# Patient Record
Sex: Female | Born: 1975 | Race: White | Hispanic: No | Marital: Married | Smoking: Never smoker
Health system: Southern US, Community
[De-identification: ages and names within clinical notes are randomized; demographics above are authoritative.]

## PROBLEM LIST (undated history)

## (undated) DIAGNOSIS — F419 Anxiety disorder, unspecified: Secondary | ICD-10-CM

## (undated) HISTORY — PX: DILATION AND CURETTAGE OF UTERUS: SHX78

---

## 2008-10-28 ENCOUNTER — Ambulatory Visit: Payer: Self-pay

## 2009-05-25 ENCOUNTER — Observation Stay: Payer: Self-pay

## 2009-05-28 ENCOUNTER — Inpatient Hospital Stay: Payer: Self-pay | Admitting: Obstetrics and Gynecology

## 2011-09-19 ENCOUNTER — Inpatient Hospital Stay: Payer: Self-pay

## 2011-09-19 LAB — CBC WITH DIFFERENTIAL/PLATELET
Basophil #: 0 10*3/uL (ref 0.0–0.1)
Eosinophil #: 0 10*3/uL (ref 0.0–0.7)
Eosinophil %: 0.1 %
Lymphocyte #: 1.6 10*3/uL (ref 1.0–3.6)
MCH: 28.2 pg (ref 26.0–34.0)
Neutrophil #: 11.1 10*3/uL — ABNORMAL HIGH (ref 1.4–6.5)
Neutrophil %: 82.8 %
RDW: 13.9 % (ref 11.5–14.5)
WBC: 13.5 10*3/uL — ABNORMAL HIGH (ref 3.6–11.0)

## 2013-04-11 LAB — HM MAMMOGRAPHY: HM Mammogram: NORMAL

## 2014-01-06 ENCOUNTER — Ambulatory Visit: Payer: Self-pay | Admitting: Nurse Practitioner

## 2014-08-02 NOTE — Op Note (Signed)
PATIENT NAME:  Vanessa Mcneil, Vanessa Mcneil MR#:  [id number] DATE OF BIRTH:  May 15, 1975  DATE OF PROCEDURE:  09/19/2011  PREOPERATIVE DIAGNOSES:  1. Intrauterine pregnancy, term. 2. Previous Cesarean section. 3. Failed attempted VBAC. 4. Fetal intolerance to labor. 5. Failure to progress.   POSTOPERATIVE DIAGNOSES:  1. Intrauterine pregnancy, term. 2. Previous Cesarean section. 3. Failed attempted VBAC. 4. Fetal intolerance to labor. 5. Failure to progress.   OPERATION PERFORMED: Low transverse Cesarean section.   SURGEON: Deloris Pinghilip Mcneil. Rashunda Passon, MD   FIRST ASSIST: jamie    OPERATIVE FINDINGS: 8 pound, 2 ounce female infant delivered at 8:05 a.m., Apgars 9 and 9, Layla.   OPERATION: After adequate conduction anesthesia, the patient was prepped and draped in routine fashion. Skin incision in modified Pfannenstiel fashion was made through previous scar and carried down the various layers and the peritoneal cavity was entered. Bladder flap was created and the bladder was pushed down. A low transverse incision was made whereupon the infant's hand delivered. This was replaced and the vertex was delivered without difficulty along with the remaining part of the body. The placenta was removed manually. The uterus was then closed with continuous lock suture of chromic one. Several additional sutures were required for hemostasis. Ultimately hemostasis was obtained. The pelvis was lavaged with copious amounts of saline. Rectus muscles were reapproximated. On-Q pump was placed. Fascia was reapproximated with continuous sutures of Maxon. Skin was closed with skin staples. Estimated blood loss 500 mL. The patient tolerated the procedure well and left the operating room in good condition. Sponge and needle counts were said to be correct at the end of the procedure.   ____________________________ Deloris PingPhilip Mcneil. Luella Cookosenow, MD pjr:drc D: 09/19/2011 08:52:14 ET T: 09/19/2011 11:33:19 ET JOB#: [id number]  cc: Deloris PingPhilip Mcneil. Luella Cookosenow, MD,  <Dictator> Towana BadgerPHILIP Mcneil Qusay Villada MD ELECTRONICALLY SIGNED 09/27/2011 6:10
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
## 2015-02-07 ENCOUNTER — Encounter: Payer: Self-pay | Admitting: Internal Medicine

## 2015-02-07 ENCOUNTER — Other Ambulatory Visit: Payer: Self-pay | Admitting: Internal Medicine

## 2015-02-07 DIAGNOSIS — B009 Herpesviral infection, unspecified: Secondary | ICD-10-CM | POA: Insufficient documentation

## 2015-02-07 DIAGNOSIS — Z309 Encounter for contraceptive management, unspecified: Secondary | ICD-10-CM | POA: Insufficient documentation

## 2015-02-07 DIAGNOSIS — B002 Herpesviral gingivostomatitis and pharyngotonsillitis: Secondary | ICD-10-CM | POA: Insufficient documentation

## 2015-02-07 DIAGNOSIS — F419 Anxiety disorder, unspecified: Secondary | ICD-10-CM | POA: Insufficient documentation

## 2015-02-07 DIAGNOSIS — Z808 Family history of malignant neoplasm of other organs or systems: Secondary | ICD-10-CM | POA: Insufficient documentation

## 2015-05-04 ENCOUNTER — Encounter: Payer: Self-pay | Admitting: Internal Medicine

## 2015-05-05 ENCOUNTER — Ambulatory Visit (INDEPENDENT_AMBULATORY_CARE_PROVIDER_SITE_OTHER): Payer: BLUE CROSS/BLUE SHIELD | Admitting: Internal Medicine

## 2015-05-05 ENCOUNTER — Encounter: Payer: Self-pay | Admitting: Internal Medicine

## 2015-05-05 VITALS — BP 112/80 | HR 72 | Ht 66.5 in | Wt 173.4 lb

## 2015-05-05 DIAGNOSIS — F419 Anxiety disorder, unspecified: Secondary | ICD-10-CM | POA: Diagnosis not present

## 2015-05-05 DIAGNOSIS — S76011A Strain of muscle, fascia and tendon of right hip, initial encounter: Secondary | ICD-10-CM

## 2015-05-05 MED ORDER — BUPROPION HCL ER (XL) 300 MG PO TB24: 300.0000 mg | ORAL_TABLET | Freq: Every day | ORAL | Status: DC
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
## 2015-05-26 ENCOUNTER — Other Ambulatory Visit: Payer: Self-pay | Admitting: Internal Medicine

## 2015-07-14 ENCOUNTER — Encounter: Payer: Self-pay | Admitting: Internal Medicine

## 2015-08-16 ENCOUNTER — Ambulatory Visit (INDEPENDENT_AMBULATORY_CARE_PROVIDER_SITE_OTHER): Payer: BC Managed Care – PPO

## 2015-08-16 DIAGNOSIS — A184 Tuberculosis of skin and subcutaneous tissue: Secondary | ICD-10-CM

## 2015-08-16 MED ORDER — TUBERCULIN PPD 5 UNIT/0.1ML ID SOLN
5.0000 [IU] | Freq: Once | INTRADERMAL | Status: AC
  Administered 2015-08-16: 5 [IU] via INTRADERMAL

## 2015-11-24 ENCOUNTER — Other Ambulatory Visit: Payer: Self-pay | Admitting: Internal Medicine

## 2015-11-24 ENCOUNTER — Encounter: Payer: Self-pay | Admitting: Internal Medicine

## 2015-11-24 ENCOUNTER — Ambulatory Visit (INDEPENDENT_AMBULATORY_CARE_PROVIDER_SITE_OTHER): Payer: BC Managed Care – PPO | Admitting: Internal Medicine

## 2015-11-24 VITALS — BP 108/78 | HR 80 | Temp 98.1°F | Resp 16 | Ht 66.5 in | Wt 163.4 lb

## 2015-11-24 DIAGNOSIS — J40 Bronchitis, not specified as acute or chronic: Secondary | ICD-10-CM | POA: Diagnosis not present

## 2015-11-24 DIAGNOSIS — F419 Anxiety disorder, unspecified: Secondary | ICD-10-CM

## 2015-11-24 MED ORDER — LEVOFLOXACIN 500 MG PO TABS: 500.0000 mg | ORAL_TABLET | Freq: Every day | ORAL | 0 refills | Status: DC

## 2015-11-24 MED ORDER — ALBUTEROL SULFATE HFA 108 (90 BASE) MCG/ACT IN AERS: 2.0000 | INHALATION_SPRAY | Freq: Four times a day (QID) | RESPIRATORY_TRACT | 0 refills | Status: DC | PRN

## 2015-11-24 NOTE — Patient Instructions (Addendum)
Begin Mucinex-DM twice a day Acute Bronchitis Bronchitis is inflammation of the airways that extend from the windpipe into the lungs (bronchi). The inflammation often causes mucus to develop. This leads to a cough, which is the most common symptom of bronchitis.  In acute bronchitis, the condition usually develops suddenly and goes away over time, usually in a couple weeks. Smoking, allergies, and asthma can make bronchitis worse. Repeated episodes of bronchitis may cause further lung problems.  CAUSES Acute bronchitis is most often caused by the same virus that causes a cold. The virus can spread from person to person (contagious) through coughing, sneezing, and touching contaminated objects. SIGNS AND SYMPTOMS   Cough.   Fever.   Coughing up mucus.   Body aches.   Chest congestion.   Chills.   Shortness of breath.   Sore throat.  DIAGNOSIS  Acute bronchitis is usually diagnosed through a physical exam. Your health care provider will also ask you questions about your medical history. Tests, such as chest X-rays, are sometimes done to rule out other conditions.  TREATMENT  Acute bronchitis usually goes away in a couple weeks. Oftentimes, no medical treatment is necessary. Medicines are sometimes given for relief of fever or cough. Antibiotic medicines are usually not needed but may be prescribed in certain situations. In some cases, an inhaler may be recommended to help reduce shortness of breath and control the cough. A cool mist vaporizer may also be used to help thin bronchial secretions and make it easier to clear the chest.  HOME CARE INSTRUCTIONS  Get plenty of rest.   Drink enough fluids to keep your urine clear or pale yellow (unless you have a medical condition that requires fluid restriction). Increasing fluids may help thin your respiratory secretions (sputum) and reduce chest congestion, and it will prevent dehydration.   Take medicines only as directed by your  health care provider.  If you were prescribed an antibiotic medicine, finish it all even if you start to feel better.  Avoid smoking and secondhand smoke. Exposure to cigarette smoke or irritating chemicals will make bronchitis worse. If you are a smoker, consider using nicotine gum or skin patches to help control withdrawal symptoms. Quitting smoking will help your lungs heal faster.   Reduce the chances of another bout of acute bronchitis by washing your hands frequently, avoiding people with cold symptoms, and trying not to touch your hands to your mouth, nose, or eyes.   Keep all follow-up visits as directed by your health care provider.  SEEK MEDICAL CARE IF: Your symptoms do not improve after 1 week of treatment.  SEEK IMMEDIATE MEDICAL CARE IF:  You develop an increased fever or chills.   You have chest pain.   You have severe shortness of breath.  You have bloody sputum.   You develop dehydration.  You faint or repeatedly feel like you are going to pass out.  You develop repeated vomiting.  You develop a severe headache. MAKE SURE YOU:   Understand these instructions.  Will watch your condition.  Will get help right away if you are not doing well or get worse.   This information is not intended to replace advice given to you by your health care provider. Make sure you discuss any questions you have with your health care provider.   Document Released: 05/04/2004 Document Revised: 04/17/2014 Document Reviewed: 09/17/2012 Elsevier Interactive Patient Education [employer]2016 Elsevier Inc.

## 2015-11-24 NOTE — Progress Notes (Signed)
    Date:  11/24/2015   Name:  Vanessa Mcneil   DOB:  03/20/1976   MRN:  [id number]   Chief Complaint: Cough (chest congestion and cough after sinus stuff for 3 weeks no fever but fatigue and has had headaches. ) HPI    Review of Systems  Patient Active Problem List   Diagnosis Date Noted  . Anxiety 02/07/2015  . Family history of malignant melanoma 02/07/2015  . Herpes 02/07/2015  . Encounter for contraceptive management 02/07/2015    Prior to Admission medications   Medication Sig Start Date End Date Taking? Authorizing Provider  buPROPion (WELLBUTRIN XL) 300 MG 24 hr tablet Take 1 tablet (300 mg total) by mouth daily. 05/05/15   Reubin MilanLaura H Wileen Duncanson, MD    Allergies  Allergen Reactions  . Penicillins     Past Surgical History:  Procedure Laterality Date  . DILATION AND CURETTAGE OF UTERUS      Social History  Substance Use Topics  . Smoking status: Never Smoker  . Smokeless tobacco: Never Used  . Alcohol use 0.0 oz/week     Comment: rarely     Medication list has been reviewed and updated.   Physical Exam  Constitutional: She is oriented to person, place, and time. She appears well-developed. No distress.  HENT:  Head: Normocephalic and atraumatic.  Right Ear: Tympanic membrane and ear canal normal.  Left Ear: Tympanic membrane and ear canal normal.  Nose: Right sinus exhibits no maxillary sinus tenderness. Left sinus exhibits no maxillary sinus tenderness.  Mouth/Throat: No posterior oropharyngeal edema or posterior oropharyngeal erythema.  Cardiovascular: Normal rate, regular rhythm and normal heart sounds.   Pulmonary/Chest: Effort normal. No respiratory distress. She has wheezes. She has no rales. She exhibits no tenderness.  Musculoskeletal: Normal range of motion.  Neurological: She is alert and oriented to person, place, and time.  Skin: Skin is warm and dry. No rash noted.  Psychiatric: She has a normal mood and affect. Her behavior is normal. Thought  content normal.  Nursing note and vitals reviewed.   BP 108/78 (BP Location: Right Arm, Patient Position: Sitting, Cuff Size: Normal)   Pulse 80   Temp 98.1 F (36.7 C) (Oral)   Resp 16   Ht 5' 6.5" (1.689 m)   Wt 163 lb 6.4 oz (74.1 kg)   LMP 11/17/2015   SpO2 98%   BMI 25.98 kg/m   Assessment and Plan: 1. Bronchitis Mucinex-DM bid Call in a week if not improving - levofloxacin (LEVAQUIN) 500 MG tablet; Take 1 tablet (500 mg total) by mouth daily.  Dispense: 10 tablet; Refill: 0 - albuterol (PROVENTIL HFA;VENTOLIN HFA) 108 (90 Base) MCG/ACT inhaler; Inhale 2 puffs into the lungs every 6 (six) hours as needed for wheezing or shortness of breath.  Dispense: 1 Inhaler; Refill: 0   Bari EdwardLaura Creek Gan, MD [facility]  11/24/2015

## 2016-04-24 ENCOUNTER — Other Ambulatory Visit: Payer: Self-pay | Admitting: Internal Medicine

## 2016-04-24 DIAGNOSIS — F419 Anxiety disorder, unspecified: Secondary | ICD-10-CM

## 2016-04-28 NOTE — Telephone Encounter (Signed)
pts coming in on 05/10/16 for her cpe

## 2016-05-08 ENCOUNTER — Other Ambulatory Visit: Payer: Self-pay | Admitting: Internal Medicine

## 2016-05-08 ENCOUNTER — Telehealth: Payer: Self-pay | Admitting: *Deleted

## 2016-05-08 NOTE — Telephone Encounter (Signed)
I would be willing to refill bupropion as long as she can be seen within the next 6 months.

## 2016-05-08 NOTE — Telephone Encounter (Signed)
Patient called and states she has a upcoming CPE with Dr Judithann GravesBerglund on 05/10/16. Patient states she is having some financial  issues and she probably wont be able to come to that appt. She is wondering if Dr Judithann GravesBerglund can refill her meds without being see on that day so she can r/s her appt for a different day. Patient is requesting a call back. Her number is [phone]. Please advise.

## 2016-05-09 ENCOUNTER — Other Ambulatory Visit: Payer: Self-pay | Admitting: Internal Medicine

## 2016-05-09 DIAGNOSIS — F419 Anxiety disorder, unspecified: Secondary | ICD-10-CM

## 2016-05-09 MED ORDER — BUPROPION HCL ER (XL) 300 MG PO TB24: 300.0000 mg | ORAL_TABLET | Freq: Every day | ORAL | 5 refills | Status: DC

## 2016-05-09 NOTE — Telephone Encounter (Signed)
Pt transfer up front to make an appointment.

## 2016-05-10 ENCOUNTER — Ambulatory Visit (INDEPENDENT_AMBULATORY_CARE_PROVIDER_SITE_OTHER): Payer: BC Managed Care – PPO | Admitting: Internal Medicine

## 2016-05-10 ENCOUNTER — Other Ambulatory Visit: Payer: Self-pay | Admitting: Internal Medicine

## 2016-05-10 ENCOUNTER — Encounter: Payer: Self-pay | Admitting: Internal Medicine

## 2016-05-10 VITALS — BP 108/78 | HR 86 | Temp 98.2°F | Ht 66.5 in | Wt 177.0 lb

## 2016-05-10 DIAGNOSIS — Z0000 Encounter for general adult medical examination without abnormal findings: Secondary | ICD-10-CM | POA: Diagnosis not present

## 2016-05-10 DIAGNOSIS — Z86018 Personal history of other benign neoplasm: Secondary | ICD-10-CM | POA: Diagnosis not present

## 2016-05-10 DIAGNOSIS — F419 Anxiety disorder, unspecified: Secondary | ICD-10-CM | POA: Diagnosis not present

## 2016-05-10 DIAGNOSIS — Z1231 Encounter for screening mammogram for malignant neoplasm of breast: Secondary | ICD-10-CM

## 2016-05-10 LAB — POCT URINALYSIS DIPSTICK
Bilirubin, UA: NEGATIVE
Blood, UA: NEGATIVE
Glucose, UA: NEGATIVE
KETONES UA: NEGATIVE
LEUKOCYTES UA: NEGATIVE
Nitrite, UA: NEGATIVE
PH UA: 6.5
PROTEIN UA: NEGATIVE
SPEC GRAV UA: 1.015
UROBILINOGEN UA: 0.2

## 2016-05-10 NOTE — Patient Instructions (Signed)
Breast Self-Awareness Introduction Breast self-awareness means being familiar with how your breasts look and feel. It involves checking your breasts regularly and reporting any changes to your health care provider. Practicing breast self-awareness is important. A change in your breasts can be a sign of a serious medical problem. Being familiar with how your breasts look and feel allows you to find any problems early, when treatment is more likely to be successful. All women should practice breast self-awareness, including women who have had breast implants. How to do a breast self-exam One way to learn what is normal for your breasts and whether your breasts are changing is to do a breast self-exam. To do a breast self-exam: Look for Changes  1. Remove all the clothing above your waist. 2. Stand in front of a mirror in a room with good lighting. 3. Put your hands on your hips. 4. Push your hands firmly downward. 5. Compare your breasts in the mirror. Look for differences between them (asymmetry), such as:  Differences in shape.  Differences in size.  Puckers, dips, and bumps in one breast and not the other. 6. Look at each breast for changes in your skin, such as:  Redness.  Scaly areas. 7. Look for changes in your nipples, such as:  Discharge.  Bleeding.  Dimpling.  Redness.  A change in position. Feel for Changes  Carefully feel your breasts for lumps and changes. It is best to do this while lying on your back on the floor and again while sitting or standing in the shower or tub with soapy water on your skin. Feel each breast in the following way:  Place the arm on the side of the breast you are examining above your head.  Feel your breast with the other hand.  Start in the nipple area and make  inch (2 cm) overlapping circles to feel your breast. Use the pads of your three middle fingers to do this. Apply light pressure, then medium pressure, then firm pressure. The light  pressure will allow you to feel the tissue closest to the skin. The medium pressure will allow you to feel the tissue that is a little deeper. The firm pressure will allow you to feel the tissue close to the ribs.  Continue the overlapping circles, moving downward over the breast until you feel your ribs below your breast.  Move one finger-width toward the center of the body. Continue to use the  inch (2 cm) overlapping circles to feel your breast as you move slowly up toward your collarbone.  Continue the up and down exam using all three pressures until you reach your armpit. Write Down What You Find  Write down what is normal for each breast and any changes that you find. Keep a written record with breast changes or normal findings for each breast. By writing this information down, you do not need to depend only on memory for size, tenderness, or location. Write down where you are in your menstrual cycle, if you are still menstruating. If you are having trouble noticing differences in your breasts, do not get discouraged. With time you will become more familiar with the variations in your breasts and more comfortable with the exam. How often should I examine my breasts? Examine your breasts every month. If you are breastfeeding, the best time to examine your breasts is after a feeding or after using a breast pump. If you menstruate, the best time to examine your breasts is 5-7 days after your   period is over. During your period, your breasts are lumpier, and it may be more difficult to notice changes. When should I see my health care provider? See your health care provider if you notice:  A change in shape or size of your breasts or nipples.  A change in the skin of your breast or nipples, such as a reddened or scaly area.  Unusual discharge from your nipples.  A lump or thick area that was not there before.  Pain in your breasts.  Anything that concerns you. This information is not  intended to replace advice given to you by your health care provider. Make sure you discuss any questions you have with your health care provider. Document Released: 03/27/2005 Document Revised: 09/02/2015 Document Reviewed: 02/14/2015  2017 Elsevier  

## 2016-05-10 NOTE — Progress Notes (Signed)
Date:  05/10/2016   Name:  Vanessa Mcneil   DOB:  October 13, 1975   MRN:  [id number]   Chief Complaint: Annual Exam Vanessa Mcneil is a 41 y.o. female who presents today for her Complete Annual Exam. She feels well. She reports exercising very little. She reports she is sleeping well. She denies breast problems.  Menses are still regular but closer together and lighter. [facility] - last pap normal about a year ago.  She is due for Mammogram.    Review of Systems  Constitutional: Negative for chills, fatigue and fever.  HENT: Negative for congestion, hearing loss, tinnitus, trouble swallowing and voice change.   Eyes: Negative for visual disturbance.  Respiratory: Negative for cough, chest tightness, shortness of breath and wheezing.   Cardiovascular: Negative for chest pain, palpitations and leg swelling.  Gastrointestinal: Negative for abdominal pain, constipation, diarrhea and vomiting.  Endocrine: Negative for polydipsia and polyuria.  Genitourinary: Negative for dysuria, frequency, genital sores, vaginal bleeding and vaginal discharge.  Musculoskeletal: Negative for arthralgias, gait problem and joint swelling.  Skin: Negative for color change and rash.  Neurological: Negative for dizziness, tremors, light-headedness and headaches.  Hematological: Negative for adenopathy. Does not bruise/bleed easily.  Psychiatric/Behavioral: Negative for behavioral problems, decreased concentration, dysphoric mood and sleep disturbance. The patient is nervous/anxious.     Patient Active Problem List   Diagnosis Date Noted  . Anxiety 02/07/2015  . Family history of malignant melanoma 02/07/2015  . Herpes 02/07/2015  . Encounter for contraceptive management 02/07/2015    Prior to Admission medications   Medication Sig Start Date End Date Taking? Authorizing Provider  albuterol (PROVENTIL HFA;VENTOLIN HFA) 108 (90 Base) MCG/ACT inhaler Inhale 2 puffs into the lungs every 6 (six)  hours as needed for wheezing or shortness of breath. 11/24/15  Yes Reubin MilanLaura H Brie Eppard, MD  buPROPion (WELLBUTRIN XL) 300 MG 24 hr tablet Take 1 tablet (300 mg total) by mouth daily. 05/09/16  Yes Reubin MilanLaura H Raygan Skarda, MD    Allergies  Allergen Reactions  . Penicillins     Past Surgical History:  Procedure Laterality Date  . DILATION AND CURETTAGE OF UTERUS      Social History  Substance Use Topics  . Smoking status: Never Smoker  . Smokeless tobacco: Never Used  . Alcohol use 0.0 oz/week     Comment: rarely     Medication list has been reviewed and updated.   Physical Exam  Constitutional: She is oriented to person, place, and time. She appears well-developed and well-nourished. No distress.  HENT:  Head: Normocephalic and atraumatic.  Right Ear: Tympanic membrane and ear canal normal.  Left Ear: Tympanic membrane and ear canal normal.  Nose: Right sinus exhibits no maxillary sinus tenderness. Left sinus exhibits no maxillary sinus tenderness.  Mouth/Throat: Uvula is midline and oropharynx is clear and moist.  Eyes: Conjunctivae and EOM are normal. Right eye exhibits no discharge. Left eye exhibits no discharge. No scleral icterus.  Neck: Normal range of motion. Carotid bruit is not present. No erythema present. No thyromegaly present.  Cardiovascular: Normal rate, regular rhythm, normal heart sounds and normal pulses.   Pulmonary/Chest: Effort normal. No respiratory distress. She has no wheezes. Right breast exhibits no mass, no nipple discharge, no skin change and no tenderness. Left breast exhibits no mass, no nipple discharge, no skin change and no tenderness.  Abdominal: Soft. Bowel sounds are normal. There is no hepatosplenomegaly. There is no tenderness. There is no  CVA tenderness.  Musculoskeletal: Normal range of motion.  Lymphadenopathy:    She has no cervical adenopathy.    She has no axillary adenopathy.  Neurological: She is alert and oriented to person, place, and  time. She has normal reflexes. No cranial nerve deficit or sensory deficit.  Skin: Skin is warm, dry and intact. No rash noted.  Psychiatric: She has a normal mood and affect. Her speech is normal and behavior is normal. Thought content normal.  Nursing note and vitals reviewed.   BP 108/78   Pulse 86   Temp 98.2 F (36.8 C)   Ht 5' 6.5" (1.689 m)   Wt 177 lb (80.3 kg)   SpO2 98%   BMI 28.14 kg/m   Assessment and Plan: 1. Annual physical exam Resume regular exercise Continue healthy diet F/u with GYN for Pap and Mammogram - CBC with Differential/Platelet - Comprehensive metabolic panel - Lipid panel - POCT urinalysis dipstick  2. Anxiety Doing well on Bupropion - TSH  3. H/O dysplastic nevus Recommend annual skin survey   Vanessa Edward, MD [facility]  05/10/2016

## 2016-05-11 LAB — CBC WITH DIFFERENTIAL/PLATELET
BASOS ABS: 0 10*3/uL (ref 0.0–0.2)
Basos: 1 %
EOS (ABSOLUTE): 0.1 10*3/uL (ref 0.0–0.4)
EOS: 1 %
HEMATOCRIT: 45.9 % (ref 34.0–46.6)
HEMOGLOBIN: 14.4 g/dL (ref 11.1–15.9)
Immature Grans (Abs): 0 10*3/uL (ref 0.0–0.1)
Immature Granulocytes: 0 %
LYMPHS ABS: 2.4 10*3/uL (ref 0.7–3.1)
LYMPHS: 39 %
MCH: 28.5 pg (ref 26.6–33.0)
MCHC: 31.4 g/dL — AB (ref 31.5–35.7)
MCV: 91 fL (ref 79–97)
Monocytes Absolute: 0.6 10*3/uL (ref 0.1–0.9)
Monocytes: 9 %
NEUTROS ABS: 3.1 10*3/uL (ref 1.4–7.0)
Neutrophils: 50 %
Platelets: 258 10*3/uL (ref 150–379)
RBC: 5.05 x10E6/uL (ref 3.77–5.28)
RDW: 14.2 % (ref 12.3–15.4)
WBC: 6.2 10*3/uL (ref 3.4–10.8)

## 2016-05-11 LAB — COMPREHENSIVE METABOLIC PANEL
ALBUMIN: 4.6 g/dL (ref 3.5–5.5)
ALT: 16 IU/L (ref 0–32)
AST: 15 IU/L (ref 0–40)
Albumin/Globulin Ratio: 2.1 (ref 1.2–2.2)
Alkaline Phosphatase: 56 IU/L (ref 39–117)
BUN / CREAT RATIO: 17 (ref 9–23)
BUN: 13 mg/dL (ref 6–24)
Bilirubin Total: 0.8 mg/dL (ref 0.0–1.2)
CALCIUM: 9.5 mg/dL (ref 8.7–10.2)
CO2: 19 mmol/L (ref 18–29)
CREATININE: 0.78 mg/dL (ref 0.57–1.00)
Chloride: 102 mmol/L (ref 96–106)
GFR, EST AFRICAN AMERICAN: 108 mL/min/{1.73_m2} (ref >59)
GFR, EST NON AFRICAN AMERICAN: 94 mL/min/{1.73_m2} (ref >59)
GLOBULIN, TOTAL: 2.2 g/dL (ref 1.5–4.5)
GLUCOSE: 90 mg/dL (ref 65–99)
Potassium: 4.4 mmol/L (ref 3.5–5.2)
SODIUM: 142 mmol/L (ref 134–144)
TOTAL PROTEIN: 6.8 g/dL (ref 6.0–8.5)

## 2016-05-11 LAB — TSH: TSH: 2.04 u[IU]/mL (ref 0.450–4.500)

## 2016-05-11 LAB — LIPID PANEL
CHOL/HDL RATIO: 2.3 ratio (ref 0.0–4.4)
CHOLESTEROL TOTAL: 152 mg/dL (ref 100–199)
HDL: 67 mg/dL (ref >39)
LDL CALC: 72 mg/dL (ref 0–99)
Triglycerides: 65 mg/dL (ref 0–149)
VLDL CHOLESTEROL CAL: 13 mg/dL (ref 5–40)

## 2016-05-16 ENCOUNTER — Encounter: Payer: Self-pay | Admitting: Radiology

## 2016-05-16 ENCOUNTER — Ambulatory Visit
Admission: RE | Admit: 2016-05-16 | Discharge: 2016-05-16 | Disposition: A | Discharge status: Home / Self Care | Payer: BC Managed Care – PPO | Source: Ambulatory Visit | Attending: Internal Medicine | Admitting: Internal Medicine

## 2016-05-16 DIAGNOSIS — Z1231 Encounter for screening mammogram for malignant neoplasm of breast: Secondary | ICD-10-CM | POA: Insufficient documentation

## 2016-05-26 ENCOUNTER — Other Ambulatory Visit: Payer: Self-pay | Admitting: Internal Medicine

## 2016-05-26 DIAGNOSIS — F419 Anxiety disorder, unspecified: Secondary | ICD-10-CM

## 2016-10-30 ENCOUNTER — Ambulatory Visit: Payer: Self-pay | Admitting: Obstetrics and Gynecology

## 2016-11-15 ENCOUNTER — Ambulatory Visit: Payer: Self-pay | Admitting: Obstetrics and Gynecology

## 2017-01-01 ENCOUNTER — Ambulatory Visit (INDEPENDENT_AMBULATORY_CARE_PROVIDER_SITE_OTHER): Payer: BC Managed Care – PPO | Admitting: Obstetrics and Gynecology

## 2017-01-01 ENCOUNTER — Encounter: Payer: Self-pay | Admitting: Obstetrics and Gynecology

## 2017-01-01 VITALS — BP 100/70 | HR 100 | Ht 66.0 in | Wt 178.0 lb

## 2017-01-01 DIAGNOSIS — Z124 Encounter for screening for malignant neoplasm of cervix: Secondary | ICD-10-CM | POA: Diagnosis not present

## 2017-01-01 DIAGNOSIS — Z01419 Encounter for gynecological examination (general) (routine) without abnormal findings: Secondary | ICD-10-CM | POA: Diagnosis not present

## 2017-01-01 DIAGNOSIS — Z1231 Encounter for screening mammogram for malignant neoplasm of breast: Secondary | ICD-10-CM

## 2017-01-01 DIAGNOSIS — F411 Generalized anxiety disorder: Secondary | ICD-10-CM | POA: Diagnosis not present

## 2017-01-01 DIAGNOSIS — Z1239 Encounter for other screening for malignant neoplasm of breast: Secondary | ICD-10-CM

## 2017-01-01 MED ORDER — HYDROXYZINE HCL 25 MG PO TABS: 25.0000 mg | ORAL_TABLET | Freq: Four times a day (QID) | ORAL | 2 refills | Status: DC | PRN

## 2017-01-01 NOTE — Patient Instructions (Signed)
Preventive Care 40-64 Years, Female Preventive care refers to lifestyle choices and visits with your health care provider that can promote health and wellness. What does preventive care include?  A yearly physical exam. This is also called an annual well check.  Dental exams once or twice a year.  Routine eye exams. Ask your health care provider how often you should have your eyes checked.  Personal lifestyle choices, including: ? Daily care of your teeth and gums. ? Regular physical activity. ? Eating a healthy diet. ? Avoiding tobacco and drug use. ? Limiting alcohol use. ? Practicing safe sex. ? Taking low-dose aspirin daily starting at age 58. ? Taking vitamin and mineral supplements as recommended by your health care provider. What happens during an annual well check? The services and screenings done by your health care provider during your annual well check will depend on your age, overall health, lifestyle risk factors, and family history of disease. Counseling Your health care provider may ask you questions about your:  Alcohol use.  Tobacco use.  Drug use.  Emotional well-being.  Home and relationship well-being.  Sexual activity.  Eating habits.  Work and work Statistician.  Method of birth control.  Menstrual cycle.  Pregnancy history.  Screening You may have the following tests or measurements:  Height, weight, and BMI.  Blood pressure.  Lipid and cholesterol levels. These may be checked every 5 years, or more frequently if you are over 81 years old.  Skin check.  Lung cancer screening. You may have this screening every year starting at age 78 if you have a 30-pack-year history of smoking and currently smoke or have quit within the past 15 years.  Fecal occult blood test (FOBT) of the stool. You may have this test every year starting at age 65.  Flexible sigmoidoscopy or colonoscopy. You may have a sigmoidoscopy every 5 years or a colonoscopy  every 10 years starting at age 30.  Hepatitis C blood test.  Hepatitis B blood test.  Sexually transmitted disease (STD) testing.  Diabetes screening. This is done by checking your blood sugar (glucose) after you have not eaten for a while (fasting). You may have this done every 1-3 years.  Mammogram. This may be done every 1-2 years. Talk to your health care provider about when you should start having regular mammograms. This may depend on whether you have a family history of breast cancer.  BRCA-related cancer screening. This may be done if you have a family history of breast, ovarian, tubal, or peritoneal cancers.  Pelvic exam and Pap test. This may be done every 3 years starting at age 80. Starting at age 36, this may be done every 5 years if you have a Pap test in combination with an HPV test.  Bone density scan. This is done to screen for osteoporosis. You may have this scan if you are at high risk for osteoporosis.  Discuss your test results, treatment options, and if necessary, the need for more tests with your health care provider. Vaccines Your health care provider may recommend certain vaccines, such as:  Influenza vaccine. This is recommended every year.  Tetanus, diphtheria, and acellular pertussis (Tdap, Td) vaccine. You may need a Td booster every 10 years.  Varicella vaccine. You may need this if you have not been vaccinated.  Zoster vaccine. You may need this after age 5.  Measles, mumps, and rubella (MMR) vaccine. You may need at least one dose of MMR if you were born in  1957 or later. You may also need a second dose.  Pneumococcal 13-valent conjugate (PCV13) vaccine. You may need this if you have certain conditions and were not previously vaccinated.  Pneumococcal polysaccharide (PPSV23) vaccine. You may need one or two doses if you smoke cigarettes or if you have certain conditions.  Meningococcal vaccine. You may need this if you have certain  conditions.  Hepatitis A vaccine. You may need this if you have certain conditions or if you travel or work in places where you may be exposed to hepatitis A.  Hepatitis B vaccine. You may need this if you have certain conditions or if you travel or work in places where you may be exposed to hepatitis B.  Haemophilus influenzae type b (Hib) vaccine. You may need this if you have certain conditions.  Talk to your health care provider about which screenings and vaccines you need and how often you need them. This information is not intended to replace advice given to you by your health care provider. Make sure you discuss any questions you have with your health care provider. Document Released: 04/23/2015 Document Revised: 12/15/2015 Document Reviewed: 01/26/2015 Elsevier Interactive Patient Education  2017 [employer].

## 2017-01-01 NOTE — Progress Notes (Signed)
Patient ID: Vanessa Mcneil, female   DOB: 09/23/1975, 41 y.o.   MRN: [id number]    Gynecology Annual Exam  PCP: Reubin Milan, MD  Chief Complaint:  Chief Complaint  Patient presents with  . Gynecologic Exam    History of Present Illness: Patient is a 41 y.o. [id number] presents for annual exam. The patient has no complaints today.   LMP: Patient's last menstrual period was 12/20/2016. Regular monthly bleeding, lasting 4-5 days, no intermenstrual spotting, no postcoital spotting   The patient is sexually active. She currently uses vasectomy for contraception. She denies dyspareunia.  The patient does perform self breast exams.  There is no notable family history of breast or ovarian cancer in her family.  The patient wears seatbelts: yes.   The patient has regular exercise: not asked.    The patient reports current symptoms of depression.  She has a history of generalized anxiety, recently had Welbutrin dose increased.  She inquired about additional options  Review of Systems: Review of Systems  Constitutional: Negative for chills and fever.  HENT: Negative for congestion.   Respiratory: Negative for cough and shortness of breath.   Cardiovascular: Negative for chest pain and palpitations.  Gastrointestinal: Negative for abdominal pain, constipation, diarrhea, heartburn, nausea and vomiting.  Genitourinary: Negative for dysuria, frequency and urgency.  Skin: Negative for itching and rash.  Neurological: Negative for dizziness and headaches.  Endo/Heme/Allergies: Negative for polydipsia.  Psychiatric/Behavioral: Negative for depression.    Past Medical History:  History reviewed. No pertinent past medical history.  Past Surgical History:  Past Surgical History:  Procedure Laterality Date  . DILATION AND CURETTAGE OF UTERUS      Gynecologic History:  Patient's last menstrual period was 12/20/2016. Contraception: vasectomy Last Pap: Results were: 01/20/15 NIL and HR HPV  negative  Last mammogram: 05/16/16 Results were: Elby Showers I   Obstetric History: [id number]  Family History:  Family History  Problem Relation Age of Onset  . Diabetes Father   . Breast cancer Neg Hx     Social History:  Social History   Social History  . Marital status: Married    Spouse name: N/A  . Number of children: N/A  . Years of education: N/A   Occupational History  . Not on file.   Social History Main Topics  . Smoking status: Never Smoker  . Smokeless tobacco: Never Used  . Alcohol use 0.0 oz/week     Comment: rarely  . Drug use: No  . Sexual activity: Yes     Comment: Vasectomy   Other Topics Concern  . Not on file   Social History Narrative  . No narrative on file    Allergies:  Allergies  Allergen Reactions  . Penicillins     Medications: Prior to Admission medications   Medication Sig Start Date End Date Taking? Authorizing Provider  buPROPion (WELLBUTRIN XL) 300 MG 24 hr tablet Take 1 tablet (300 mg total) by mouth daily. 05/09/16  Yes Reubin Milan, MD    Physical Exam Vitals: Blood pressure 100/70, pulse 100, height  (1.676 m), weight 178 lb (80.7 kg), last menstrual period 12/20/2016.  General: NAD HEENT: normocephalic, anicteric Thyroid: no enlargement, no palpable nodules Pulmonary: No increased work of breathing, CTAB Cardiovascular: RRR, distal pulses 2+ Breast: Breast symmetrical, no tenderness, no palpable nodules or masses, no skin or nipple retraction present, no nipple discharge.  No axillary or supraclavicular lymphadenopathy. Abdomen: NABS, soft, non-tender, non-distended.  Umbilicus without lesions.  No hepatomegaly, splenomegaly or masses palpable. No evidence of hernia  Genitourinary:  External: Normal external female genitalia.  Normal urethral meatus, normal  Bartholin's and Skene's glands.    Vagina: Normal vaginal mucosa, no evidence of prolapse.    Cervix: Grossly normal in appearance, no bleeding  Uterus:  Non-enlarged, mobile, normal contour.  No CMT  Adnexa: ovaries non-enlarged, no adnexal masses  Rectal: deferred  Lymphatic: no evidence of inguinal lymphadenopathy Extremities: no edema, erythema, or tenderness Neurologic: Grossly intact Psychiatric: mood appropriate, affect full  Female chaperone present for pelvic and breast  portions of the physical exam    Assessment: 41 y.o. [id number] routine annual exam  Plan: Problem List Items Addressed This Visit    None    Visit Diagnoses    Generalized anxiety disorder    -  Primary   Screening for malignant neoplasm of cervix       Relevant Orders   PapIG, HPV, rfx 16/18   Breast screening       Relevant Orders   MM DIGITAL SCREENING BILATERAL   Encounter for gynecological examination without abnormal finding       Relevant Orders   PapIG, HPV, rfx 16/18      1) Mammogram - recommend yearly screening mammogram.  Mammogram Is up to date   2) STI screening was not offered  3) ASCCP guidelines and rational discussed.  Patient opts for 2 year screening interval  4) Contraception - vasectomy  5) Colonoscopy -- Screening recommended starting at age 21 for average risk individuals, age 16 for individuals deemed at increased risk (including African Americans) and recommended to continue until age 53.  For patient age 43-43 individualized approach is recommended.  Gold standard screening is via colonoscopy, Cologuard screening is an acceptable alternative for patient unwilling or unable to undergo colonoscopy.  "Colorectal cancer screening for average?risk adults: 2018 guideline update from the American Cancer Society"CA: A Cancer Journal for Clinicians: Sep 06, 2016   6) Routine healthcare maintenance including cholesterol, diabetes screening discussed managed by PCP  7) Anxiety - add vistaril to current regimen prn we discussed if continued symptoms consider switching to lexapro from welbutrin

## 2017-01-03 ENCOUNTER — Encounter: Payer: Self-pay | Admitting: Obstetrics and Gynecology

## 2017-01-03 LAB — PAPIG, HPV, RFX 16/18
HPV, high-risk: NEGATIVE
PAP Smear Comment: 0

## 2017-01-15 ENCOUNTER — Encounter: Payer: Self-pay | Admitting: Obstetrics and Gynecology

## 2017-01-22 ENCOUNTER — Telehealth: Payer: Self-pay

## 2017-01-22 ENCOUNTER — Other Ambulatory Visit: Payer: Self-pay | Admitting: Obstetrics and Gynecology

## 2017-01-22 MED ORDER — ALPRAZOLAM 0.25 MG PO TABS: 0.2500 mg | ORAL_TABLET | Freq: Two times a day (BID) | ORAL | 0 refills | Status: DC | PRN

## 2017-01-22 NOTE — Telephone Encounter (Signed)
Left msg for pt rx ready for pick up.

## 2017-01-22 NOTE — Telephone Encounter (Signed)
Pt calling to see if Xanax rx is ready to be p/u.  [phone]

## 2017-01-30 ENCOUNTER — Ambulatory Visit: Payer: BC Managed Care – PPO

## 2017-02-05 ENCOUNTER — Ambulatory Visit (INDEPENDENT_AMBULATORY_CARE_PROVIDER_SITE_OTHER): Payer: BC Managed Care – PPO

## 2017-02-05 DIAGNOSIS — Z23 Encounter for immunization: Secondary | ICD-10-CM | POA: Diagnosis not present

## 2017-05-14 ENCOUNTER — Ambulatory Visit (INDEPENDENT_AMBULATORY_CARE_PROVIDER_SITE_OTHER): Payer: BC Managed Care – PPO | Admitting: Internal Medicine

## 2017-05-14 ENCOUNTER — Encounter: Payer: Self-pay | Admitting: Internal Medicine

## 2017-05-14 VITALS — BP 112/80 | HR 80 | Ht 66.0 in | Wt 180.0 lb

## 2017-05-14 DIAGNOSIS — F419 Anxiety disorder, unspecified: Secondary | ICD-10-CM | POA: Diagnosis not present

## 2017-05-14 DIAGNOSIS — F40298 Other specified phobia: Secondary | ICD-10-CM | POA: Diagnosis not present

## 2017-05-14 DIAGNOSIS — Z111 Encounter for screening for respiratory tuberculosis: Secondary | ICD-10-CM

## 2017-05-14 DIAGNOSIS — Z0000 Encounter for general adult medical examination without abnormal findings: Secondary | ICD-10-CM

## 2017-05-14 DIAGNOSIS — Z1239 Encounter for other screening for malignant neoplasm of breast: Secondary | ICD-10-CM

## 2017-05-14 LAB — POCT URINALYSIS DIPSTICK
BILIRUBIN UA: NEGATIVE
Blood, UA: NEGATIVE
GLUCOSE UA: NEGATIVE
KETONES UA: NEGATIVE
Leukocytes, UA: NEGATIVE
NITRITE UA: NEGATIVE
PROTEIN UA: NEGATIVE
Spec Grav, UA: 1.01 (ref 1.010–1.025)
Urobilinogen, UA: 0.2 E.U./dL
pH, UA: 6 (ref 5.0–8.0)

## 2017-05-14 NOTE — Patient Instructions (Signed)

## 2017-05-14 NOTE — Progress Notes (Signed)
Date:  05/14/2017   Name:  Vanessa Mcneil   DOB:  [birth year]   MRN:  [id number]   Chief Complaint: Annual Exam Vanessa Mcneil is a 42 y.o. female who presents today for her Complete Annual Exam. She feels fairly well. She reports exercising regularly. She reports she is sleeping well.  Pt had GYN exam and Pap in September 2018 - normal.  Mammogram ordered but not yet scheduled. 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  She recently had more anxiety and took additional xanax in December after her grandfather passed away.  She is doing better now and has not had to take any xanax in over a month.  She continues on Wellbutrin daily.   Anxiety  Presents for follow-up visit. Symptoms include nervous/anxious behavior. Patient reports no chest pain, dizziness, palpitations or shortness of breath. Symptoms occur rarely (taking Wellbutrin and Xanax prn). The severity of symptoms is mild. The quality of sleep is good.       Review of Systems  Constitutional: Negative for chills, fatigue and fever.  HENT: Negative for congestion, hearing loss, tinnitus, trouble swallowing and voice change.   Eyes: Negative for visual disturbance.  Respiratory: Positive for cough. Negative for chest tightness, shortness of breath and wheezing.   Cardiovascular: Negative for chest pain, palpitations and leg swelling.  Gastrointestinal: Negative for abdominal pain, constipation, diarrhea and vomiting.  Endocrine: Negative for polydipsia and polyuria.  Genitourinary: Negative for dysuria, frequency, genital sores, vaginal bleeding and vaginal discharge.  Musculoskeletal: Negative for arthralgias, gait problem and joint swelling.  Skin: Negative for color change and rash.  Neurological: Negative for dizziness, tremors, light-headedness and headaches.  Hematological: Negative for adenopathy. Does not bruise/bleed easily.  Psychiatric/Behavioral: Negative for dysphoric  mood and sleep disturbance. The patient is nervous/anxious.     Patient Active Problem List   Diagnosis Date Noted  . Anxiety 02/07/2015  . Family history of malignant melanoma 02/07/2015  . Herpes 02/07/2015  . Encounter for contraceptive management 02/07/2015    Prior to Admission medications   Medication Sig Start Date End Date Taking? Authorizing Provider  ALPRAZolam (XANAX) 0.25 MG tablet Take 1 tablet (0.25 mg total) by mouth 2 (two) times daily as needed for anxiety. 01/22/17  Yes Vena AustriaStaebler, Andreas, MD  buPROPion (WELLBUTRIN XL) 300 MG 24 hr tablet Take 1 tablet (300 mg total) by mouth daily. 05/09/16  Yes Reubin MilanBerglund, Tarnisha Kachmar H, MD    01/01/17  Yes Vena AustriaStaebler, Andreas, MD    Allergies  Allergen Reactions  . Penicillins     Past Surgical History:  Procedure Laterality Date  . DILATION AND CURETTAGE OF UTERUS      Social History   Tobacco Use  . Smoking status: Never Smoker  . Smokeless tobacco: Never Used  Substance Use Topics  . Alcohol use: Yes    Alcohol/week: 0.0 oz    Comment: rarely  . Drug use: No     Medication list has been reviewed and updated.  PHQ 2/9 Scores 05/14/2017 05/10/2016  PHQ - 2 Score 0 0    Physical Exam  Constitutional: She is oriented to person, place, and time. She appears well-developed. No distress.  HENT:  Head: Normocephalic and atraumatic.  Neck: Normal range of motion. Neck supple. Carotid bruit is not present. No thyromegaly present.  Cardiovascular: Normal rate and normal heart sounds.  Pulmonary/Chest: Effort normal and breath sounds normal. No respiratory distress. She has  no wheezes. She has no rales.  Abdominal: Soft. Normal appearance and bowel sounds are normal. There is no tenderness.  Musculoskeletal: Normal range of motion. She exhibits no edema.  Neurological: She is alert and oriented to person, place, and time. She has normal strength and normal reflexes. No sensory deficit.  Skin: Skin is warm, dry and intact. No rash  noted.  Psychiatric: She has a normal mood and affect. Her behavior is normal. Thought content normal.  Nursing note and vitals reviewed.   BP 112/80   Pulse 80   Ht 5\' 6"  (1.676 m)   Wt 180 lb (81.6 kg)   SpO2 98%   BMI 29.05 kg/m   Assessment and Plan: 1. Annual physical exam Normal exam Continue diet and exercise - CBC with Differential/Platelet - Comprehensive metabolic panel - POCT urinalysis dipstick  2. Breast cancer screening Annual screening recommended  3. Anxiety Doing well on medications - TSH  4. Screening for tuberculosis Lab done rather than additional stick for PPD due to syncope with needle sticks - QuantiFERON-TB Gold Plus   No orders of the defined types were placed in this encounter.   Partially dictated using Animal nutritionist. Any errors are unintentional.  Bari Edward, MD [facility]  05/14/2017

## 2017-05-17 LAB — QUANTIFERON-TB GOLD PLUS
QuantiFERON Nil Value: 0.09 IU/mL
QuantiFERON TB1 Ag Value: 0.07 IU/mL
QuantiFERON TB2 Ag Value: 0.04 IU/mL
QuantiFERON-TB Gold Plus: NEGATIVE

## 2017-05-17 LAB — COMPREHENSIVE METABOLIC PANEL
A/G RATIO: 2 (ref 1.2–2.2)
ALBUMIN: 4.3 g/dL (ref 3.5–5.5)
ALT: 19 IU/L (ref 0–32)
AST: 19 IU/L (ref 0–40)
Alkaline Phosphatase: 68 IU/L (ref 39–117)
BUN / CREAT RATIO: 16 (ref 9–23)
BUN: 13 mg/dL (ref 6–24)
Bilirubin Total: 0.3 mg/dL (ref 0.0–1.2)
CALCIUM: 9.1 mg/dL (ref 8.7–10.2)
CO2: 23 mmol/L (ref 20–29)
Chloride: 106 mmol/L (ref 96–106)
Creatinine, Ser: 0.79 mg/dL (ref 0.57–1.00)
GFR calc non Af Amer: 92 mL/min/{1.73_m2} (ref >59)
GFR, EST AFRICAN AMERICAN: 106 mL/min/{1.73_m2} (ref >59)
GLOBULIN, TOTAL: 2.1 g/dL (ref 1.5–4.5)
Glucose: 86 mg/dL (ref 65–99)
POTASSIUM: 4.6 mmol/L (ref 3.5–5.2)
SODIUM: 144 mmol/L (ref 134–144)
TOTAL PROTEIN: 6.4 g/dL (ref 6.0–8.5)

## 2017-05-17 LAB — CBC WITH DIFFERENTIAL/PLATELET
BASOS: 1 %
Basophils Absolute: 0 10*3/uL (ref 0.0–0.2)
EOS (ABSOLUTE): 0.1 10*3/uL (ref 0.0–0.4)
EOS: 1 %
HEMATOCRIT: 41.7 % (ref 34.0–46.6)
Hemoglobin: 13.3 g/dL (ref 11.1–15.9)
IMMATURE GRANULOCYTES: 0 %
Immature Grans (Abs): 0 10*3/uL (ref 0.0–0.1)
Lymphocytes Absolute: 2.1 10*3/uL (ref 0.7–3.1)
Lymphs: 39 %
MCH: 28.1 pg (ref 26.6–33.0)
MCHC: 31.9 g/dL (ref 31.5–35.7)
MCV: 88 fL (ref 79–97)
Monocytes Absolute: 0.4 10*3/uL (ref 0.1–0.9)
Monocytes: 8 %
NEUTROS PCT: 51 %
Neutrophils Absolute: 2.8 10*3/uL (ref 1.4–7.0)
Platelets: 272 10*3/uL (ref 150–379)
RBC: 4.73 x10E6/uL (ref 3.77–5.28)
RDW: 14 % (ref 12.3–15.4)
WBC: 5.4 10*3/uL (ref 3.4–10.8)

## 2017-05-17 LAB — TSH: TSH: 3.06 u[IU]/mL (ref 0.450–4.500)

## 2017-06-07 ENCOUNTER — Other Ambulatory Visit: Payer: Self-pay | Admitting: Internal Medicine

## 2017-06-07 DIAGNOSIS — F419 Anxiety disorder, unspecified: Secondary | ICD-10-CM

## 2017-06-19 ENCOUNTER — Ambulatory Visit
Admission: RE | Admit: 2017-06-19 | Discharge: 2017-06-19 | Disposition: A | Discharge status: Home / Self Care | Payer: BC Managed Care – PPO | Source: Ambulatory Visit | Attending: Obstetrics and Gynecology | Admitting: Obstetrics and Gynecology

## 2017-06-19 ENCOUNTER — Other Ambulatory Visit: Payer: Self-pay | Admitting: Obstetrics and Gynecology

## 2017-06-19 DIAGNOSIS — Z1231 Encounter for screening mammogram for malignant neoplasm of breast: Secondary | ICD-10-CM | POA: Diagnosis present

## 2017-06-19 DIAGNOSIS — Z1239 Encounter for other screening for malignant neoplasm of breast: Secondary | ICD-10-CM

## 2017-06-21 ENCOUNTER — Encounter: Payer: Self-pay | Admitting: Obstetrics and Gynecology

## 2017-09-06 ENCOUNTER — Ambulatory Visit: Payer: BC Managed Care – PPO | Admitting: Internal Medicine

## 2017-09-06 ENCOUNTER — Encounter: Payer: Self-pay | Admitting: Internal Medicine

## 2017-09-06 VITALS — BP 116/84 | HR 107 | Temp 98.4°F | Resp 16 | Ht 66.0 in | Wt 182.0 lb

## 2017-09-06 DIAGNOSIS — J029 Acute pharyngitis, unspecified: Secondary | ICD-10-CM

## 2017-09-06 LAB — POCT RAPID STREP A (OFFICE): Rapid Strep A Screen: NEGATIVE

## 2017-09-06 MED ORDER — AZITHROMYCIN 250 MG PO TABS: ORAL_TABLET | ORAL | 0 refills | Status: AC

## 2017-09-06 NOTE — Progress Notes (Signed)
Date:  09/06/2017   Name:  Vanessa Mcneil   DOB:  02/01/1976   MRN:  [id number]   Chief Complaint: Sore Throat Sore Throat   This is a new problem. The current episode started in the past 7 days. The problem has been gradually worsening. There has been no fever. Associated symptoms include trouble swallowing. Pertinent negatives include no ear pain, headaches or shortness of breath. She has had exposure to strep.    Review of Systems  Constitutional: Negative for chills, fatigue and fever.  HENT: Positive for sore throat and trouble swallowing. Negative for ear pain, postnasal drip and sinus pain.   Respiratory: Negative for chest tightness, shortness of breath and wheezing.   Cardiovascular: Negative for chest pain and palpitations.  Neurological: Negative for dizziness and headaches.    Patient Active Problem List   Diagnosis Date Noted  . 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 Anxiety 02/07/2015  . Family history of malignant melanoma 02/07/2015  . Herpes 02/07/2015  . Encounter for contraceptive management 02/07/2015    Prior to Admission medications   Medication Sig Start Date End Date Taking? Authorizing Provider  ALPRAZolam (XANAX) 0.25 MG tablet Take 1 tablet (0.25 mg total) by mouth 2 (two) times daily as needed for anxiety. 01/22/17  Yes Vena Austria, MD  buPROPion (WELLBUTRIN XL) 300 MG 24 hr tablet TAKE 1 TABLET (300 MG TOTAL) BY MOUTH DAILY. 06/07/17  Yes Reubin Milan, MD    Allergies  Allergen Reactions  . Penicillins     Past Surgical History:  Procedure Laterality Date  . DILATION AND CURETTAGE OF UTERUS      Social History   Tobacco Use  . Smoking status: Never Smoker  . Smokeless tobacco: Never Used  Substance Use Topics  . Alcohol use: Yes    Alcohol/week: 0.0 oz    Comment: rarely  . Drug use: No     Medication list has been reviewed and updated.  Current Meds  Medication Sig  . ALPRAZolam (XANAX) 0.25 MG tablet Take 1 tablet (0.25 mg  total) by mouth 2 (two) times daily as needed for anxiety.  Marland Kitchen buPROPion (WELLBUTRIN XL) 300 MG 24 hr tablet TAKE 1 TABLET (300 MG TOTAL) BY MOUTH DAILY.  . [DISCONTINUED] buPROPion (WELLBUTRIN XL) 300 MG 24 hr tablet Take 1 tablet (300 mg total) by mouth daily.    PHQ 2/9 Scores 05/14/2017 05/10/2016  PHQ - 2 Score 0 0    Physical Exam  Constitutional: She is oriented to person, place, and time. She appears well-developed. No distress.  HENT:  Head: Normocephalic and atraumatic.  Mouth/Throat: Uvula is midline and mucous membranes are normal. Posterior oropharyngeal erythema present. No oropharyngeal exudate.  Eyes: Pupils are equal, round, and reactive to light.  Neck: Normal range of motion. Neck supple.  Cardiovascular: Normal rate and regular rhythm.  Pulmonary/Chest: Effort normal. No respiratory distress. She has no wheezes.  Musculoskeletal: Normal range of motion.  Lymphadenopathy:    She has no cervical adenopathy.  Neurological: She is alert and oriented to person, place, and time.  Skin: Skin is warm and dry. No rash noted.  Psychiatric: She has a normal mood and affect. Her behavior is normal. Thought content normal.  Nursing note and vitals reviewed.   BP 116/84   Pulse (!) 107   Temp 98.4 F (36.9 C) (Oral)   Resp 16   Ht  (1.676 m)   Wt 182 lb (82.6 kg)   LMP  08/25/2017   SpO2 99%   BMI 29.38 kg/m   Assessment and Plan: 1. Pharyngitis, unspecified etiology Strep negative Suspect viral - will give zpak to take if s/s bacterial infection occur - azithromycin (ZITHROMAX Z-PAK) 250 MG tablet; UAD  Dispense: 6 each; Refill: 0 - POCT rapid strep A   Meds ordered this encounter  Medications  . azithromycin (ZITHROMAX Z-PAK) 250 MG tablet    Sig: UAD    Dispense:  6 each    Refill:  0    Partially dictated using Animal nutritionist. Any errors are unintentional.  Bari Edward, MD [facility]  09/06/2017   There  are no diagnoses linked to this encounter.

## 2017-09-20 ENCOUNTER — Other Ambulatory Visit: Payer: Self-pay | Admitting: Obstetrics and Gynecology

## 2017-09-20 MED ORDER — ALPRAZOLAM 0.25 MG PO TABS: 0.2500 mg | ORAL_TABLET | Freq: Two times a day (BID) | ORAL | 0 refills | Status: DC | PRN

## 2017-09-21 NOTE — Telephone Encounter (Signed)
Pt calling about xanax refill. Pt unsure if it has been filled. She had a message that it was, but pharmacy has not received it. [phone]#[phone]

## 2017-11-14 ENCOUNTER — Ambulatory Visit: Payer: BC Managed Care – PPO | Admitting: Family Medicine

## 2017-11-14 ENCOUNTER — Encounter: Payer: Self-pay | Admitting: Family Medicine

## 2017-11-14 VITALS — BP 122/78 | HR 97 | Resp 16 | Ht 66.0 in | Wt 180.0 lb

## 2017-11-14 DIAGNOSIS — B001 Herpesviral vesicular dermatitis: Secondary | ICD-10-CM | POA: Diagnosis not present

## 2017-11-14 MED ORDER — VALACYCLOVIR HCL 1 G PO TABS: 1000.0000 mg | ORAL_TABLET | Freq: Two times a day (BID) | ORAL | 0 refills | Status: DC

## 2017-11-14 NOTE — Progress Notes (Signed)
Name: Vanessa Mcneil   MRN: [id number]    DOB: May 05, 1975   Date:11/14/2017       Progress Note  Subjective  Chief Complaint  Chief Complaint  Patient presents with  . Mouth Lesions    wants valacyclovir had this 3 years ago.    Rash  This is a new (used for cold sore) problem. The current episode started today. The problem has been gradually worsening since onset. The affected locations include the lips. The rash is characterized by blistering. Associated with: sun. Pertinent negatives include no anorexia, congestion, cough, diarrhea, eye pain, facial edema, fatigue, fever, joint pain, nail changes, rhinorrhea, shortness of breath, sore throat or vomiting. Past treatments include nothing.    No problem-specific Assessment & Plan notes found for this encounter.   History reviewed. No pertinent past medical history.  Past Surgical History:  Procedure Laterality Date  . DILATION AND CURETTAGE OF UTERUS      Family History  Problem Relation Age of Onset  . Breast cancer Mother 44       ? if cancer or not  . Diabetes Father     Social History   Socioeconomic History  . Marital status: Married    Spouse name: Not on file  . Number of children: Not on file  . Years of education: Not on file  . Highest education level: Not on file  Occupational History  . Not on file  Social Needs  . Financial resource strain: Not on file  . Food insecurity:    Worry: Not on file    Inability: Not on file  . Transportation needs:    Medical: Not on file    Non-medical: Not on file  Tobacco Use  . Smoking status: Never Smoker  . Smokeless tobacco: Never Used  Substance and Sexual Activity  . Alcohol use: Yes    Alcohol/week: 0.0 oz    Comment: rarely  . Drug use: No  . Sexual activity: Yes    Comment: Vasectomy  Lifestyle  . Physical activity:    Days per week: Not on file    Minutes per session: Not on file  . Stress: Not on file  Relationships  . Social connections:     Talks on phone: Not on file    Gets together: Not on file    Attends religious service: Not on file    Active member of club or organization: Not on file    Attends meetings of clubs or organizations: Not on file    Relationship status: Not on file  . Intimate partner violence:    Fear of current or ex partner: Not on file    Emotionally abused: Not on file    Physically abused: Not on file    Forced sexual activity: Not on file  Other Topics Concern  . Not on file  Social History Narrative  . Not on file    Allergies  Allergen Reactions  . Penicillins     Outpatient Medications Prior to Visit  Medication Sig Dispense Refill  . ALPRAZolam (XANAX) 0.25 MG tablet Take 1 tablet (0.25 mg total) by mouth 2 (two) times daily as needed for anxiety. 40 tablet 0  . buPROPion (WELLBUTRIN XL) 300 MG 24 hr tablet TAKE 1 TABLET (300 MG TOTAL) BY MOUTH DAILY. 30 tablet 10   No facility-administered medications prior to visit.     Review of Systems  Constitutional: Negative for chills, fatigue, fever, malaise/fatigue and weight loss.  HENT: Negative for congestion, ear discharge, ear pain, rhinorrhea and sore throat.   Eyes: Negative for blurred vision and pain.  Respiratory: Negative for cough, sputum production, shortness of breath and wheezing.   Cardiovascular: Negative for chest pain, palpitations and leg swelling.  Gastrointestinal: Negative for abdominal pain, anorexia, blood in stool, constipation, diarrhea, heartburn, melena, nausea and vomiting.  Genitourinary: Negative for dysuria, frequency, hematuria and urgency.  Musculoskeletal: Negative for back pain, joint pain, myalgias and neck pain.  Skin: Positive for rash. Negative for nail changes.  Neurological: Negative for dizziness, tingling, sensory change, focal weakness and headaches.  Endo/Heme/Allergies: Negative for environmental allergies and polydipsia. Does not bruise/bleed easily.  Psychiatric/Behavioral: Negative for  depression and suicidal ideas. The patient is not nervous/anxious and does not have insomnia.      Objective  Vitals:   11/14/17 1333  BP: 122/78  Pulse: 97  Resp: 16  SpO2: 100%  Weight: 180 lb (81.6 kg)  Height: 5\' 6"  (1.676 m)    Physical Exam  Constitutional: No distress.  HENT:  Head: Normocephalic and atraumatic.  Right Ear: External ear normal.  Left Ear: External ear normal.  Nose: Nose normal.  Mouth/Throat: Oropharynx is clear and moist.  Eyes: Pupils are equal, round, and reactive to light. Conjunctivae and EOM are normal. Right eye exhibits no discharge. Left eye exhibits no discharge.  Neck: Normal range of motion. Neck supple. No JVD present. No thyromegaly present.  Cardiovascular: Normal rate, regular rhythm, normal heart sounds and intact distal pulses. Exam reveals no gallop and no friction rub.  No murmur heard. Pulmonary/Chest: Effort normal and breath sounds normal.  Abdominal: Soft. Bowel sounds are normal. She exhibits no mass. There is no tenderness. There is no guarding.  Musculoskeletal: Normal range of motion. She exhibits no edema.  Lymphadenopathy:    She has no cervical adenopathy.  Neurological: She is alert. She has normal reflexes.  Skin: Skin is warm and dry. Rash noted. Rash is vesicular. She is not diaphoretic.  Vesicle left upper lip  Nursing note and vitals reviewed.     Assessment & Plan  Problem List Items Addressed This Visit    None    Visit Diagnoses    Cold sore    -  Primary   Reactivated when exposed to sun damage/ refill valcyclovir 1 gm 2 bid   Relevant Medications   valACYclovir (VALTREX) 1000 MG tablet      Meds ordered this encounter  Medications  . valACYclovir (VALTREX) 1000 MG tablet    Sig: Take 1 tablet (1,000 mg total) by mouth 2 (two) times daily. 2 tablets bid for 1 day prn    Dispense:  20 tablet    Refill:  0      Dr. Elizabeth Sauereanna Eleftheria Taborn [facility]  11/14/17  Cold sore

## 2018-01-02 ENCOUNTER — Ambulatory Visit: Payer: BC Managed Care – PPO | Admitting: Internal Medicine

## 2018-01-03 ENCOUNTER — Ambulatory Visit: Payer: BC Managed Care – PPO | Admitting: Internal Medicine

## 2018-01-03 ENCOUNTER — Ambulatory Visit: Payer: BC Managed Care – PPO | Admitting: Obstetrics and Gynecology

## 2018-01-03 ENCOUNTER — Encounter: Payer: Self-pay | Admitting: Internal Medicine

## 2018-01-03 VITALS — BP 122/86 | HR 80 | Ht 66.0 in | Wt 180.0 lb

## 2018-01-03 DIAGNOSIS — Z021 Encounter for pre-employment examination: Secondary | ICD-10-CM | POA: Diagnosis not present

## 2018-01-03 DIAGNOSIS — Z23 Encounter for immunization: Secondary | ICD-10-CM | POA: Diagnosis not present

## 2018-01-03 NOTE — Patient Instructions (Signed)
Tdap Vaccine (Tetanus, Diphtheria and Pertussis): What You Need to Know 1. Why get vaccinated? Tetanus, diphtheria and pertussis are very serious diseases. Tdap vaccine can protect us from these diseases. And, Tdap vaccine given to pregnant women can protect newborn babies against pertussis. TETANUS (Lockjaw) is rare in the [place] today. It causes painful muscle tightening and stiffness, usually all over the body.  It can lead to tightening of muscles in the head and neck so you can't open your mouth, swallow, or sometimes even breathe. Tetanus kills about 1 out of 10 people who are infected even after receiving the best medical care.  DIPHTHERIA is also rare in the [place] today. It can cause a thick coating to form in the back of the throat.  It can lead to breathing problems, heart failure, paralysis, and death.  PERTUSSIS (Whooping Cough) causes severe coughing spells, which can cause difficulty breathing, vomiting and disturbed sleep.  It can also lead to weight loss, incontinence, and rib fractures. Up to 2 in 100 adolescents and 5 in 100 adults with pertussis are hospitalized or have complications, which could include pneumonia or death.  These diseases are caused by bacteria. Diphtheria and pertussis are spread from person to person through secretions from coughing or sneezing. Tetanus enters the body through cuts, scratches, or wounds. Before vaccines, as many as 200,000 cases of diphtheria, 200,000 cases of pertussis, and hundreds of cases of tetanus, were reported in the [place] each year. Since vaccination began, reports of cases for tetanus and diphtheria have dropped by about 99% and for pertussis by about 80%. 2. Tdap vaccine Tdap vaccine can protect adolescents and adults from tetanus, diphtheria, and pertussis. One dose of Tdap is routinely given at age 11 or 12. People who did not get Tdap at that age should get it as soon as possible. Tdap is especially  important for healthcare professionals and anyone having close contact with a baby younger than 12 months. Pregnant women should get a dose of Tdap during every pregnancy, to protect the newborn from pertussis. Infants are most at risk for severe, life-threatening complications from pertussis. Another vaccine, called Td, protects against tetanus and diphtheria, but not pertussis. A Td booster should be given every 10 years. Tdap may be given as one of these boosters if you have never gotten Tdap before. Tdap may also be given after a severe cut or burn to prevent tetanus infection. Your doctor or the person giving you the vaccine can give you more information. Tdap may safely be given at the same time as other vaccines. 3. Some people should not get this vaccine  A person who has ever had a life-threatening allergic reaction after a previous dose of any diphtheria, tetanus or pertussis containing vaccine, OR has a severe allergy to any part of this vaccine, should not get Tdap vaccine. Tell the person giving the vaccine about any severe allergies.  Anyone who had coma or long repeated seizures within 7 days after a childhood dose of DTP or DTaP, or a previous dose of Tdap, should not get Tdap, unless a cause other than the vaccine was found. They can still get Td.  Talk to your doctor if you: ? have seizures or another nervous system problem, ? had severe pain or swelling after any vaccine containing diphtheria, tetanus or pertussis, ? ever had a condition called Guillain-Barr Syndrome (GBS), ? aren't feeling well on the day the shot is scheduled. 4. Risks With any medicine, including   vaccines, there is a chance of side effects. These are usually mild and go away on their own. Serious reactions are also possible but are rare. Most people who get Tdap vaccine do not have any problems with it. Mild problems following Tdap: (Did not interfere with activities)  Pain where the shot was given (about  3 in 4 adolescents or 2 in 3 adults)  Redness or swelling where the shot was given (about 1 person in 5)  Mild fever of at least 100.4F (up to about 1 in 25 adolescents or 1 in 100 adults)  Headache (about 3 or 4 people in 10)  Tiredness (about 1 person in 3 or 4)  Nausea, vomiting, diarrhea, stomach ache (up to 1 in 4 adolescents or 1 in 10 adults)  Chills, sore joints (about 1 person in 10)  Body aches (about 1 person in 3 or 4)  Rash, swollen glands (uncommon)  Moderate problems following Tdap: (Interfered with activities, but did not require medical attention)  Pain where the shot was given (up to 1 in 5 or 6)  Redness or swelling where the shot was given (up to about 1 in 16 adolescents or 1 in 12 adults)  Fever over 102F (about 1 in 100 adolescents or 1 in 250 adults)  Headache (about 1 in 7 adolescents or 1 in 10 adults)  Nausea, vomiting, diarrhea, stomach ache (up to 1 or 3 people in 100)  Swelling of the entire arm where the shot was given (up to about 1 in 500).  Severe problems following Tdap: (Unable to perform usual activities; required medical attention)  Swelling, severe pain, bleeding and redness in the arm where the shot was given (rare).  Problems that could happen after any vaccine:  People sometimes faint after a medical procedure, including vaccination. Sitting or lying down for about 15 minutes can help prevent fainting, and injuries caused by a fall. Tell your doctor if you feel dizzy, or have vision changes or ringing in the ears.  Some people get severe pain in the shoulder and have difficulty moving the arm where a shot was given. This happens very rarely.  Any medication can cause a severe allergic reaction. Such reactions from a vaccine are very rare, estimated at fewer than 1 in a million doses, and would happen within a few minutes to a few hours after the vaccination. As with any medicine, there is a very remote chance of a vaccine  causing a serious injury or death. The safety of vaccines is always being monitored. For more information, visit: www.cdc.gov/vaccinesafety/ 5. What if there is a serious problem? What should I look for? Look for anything that concerns you, such as signs of a severe allergic reaction, very high fever, or unusual behavior. Signs of a severe allergic reaction can include hives, swelling of the face and throat, difficulty breathing, a fast heartbeat, dizziness, and weakness. These would usually start a few minutes to a few hours after the vaccination. What should I do?  If you think it is a severe allergic reaction or other emergency that can't wait, call 9-1-1 or get the person to the nearest hospital. Otherwise, call your doctor.  Afterward, the reaction should be reported to the Vaccine Adverse Event Reporting System (VAERS). Your doctor might file this report, or you can do it yourself through the VAERS web site at www.vaers.hhs.gov, or by calling 1-800-822-7967. ? VAERS does not give medical advice. 6. The National Vaccine Injury Compensation Program The National   Vaccine Injury Compensation Program (VICP) is a federal program that was created to compensate people who may have been injured by certain vaccines. Persons who believe they may have been injured by a vaccine can learn about the program and about filing a claim by calling 1-800-338-2382 or visiting the VICP website at www.hrsa.gov/vaccinecompensation. There is a time limit to file a claim for compensation. 7. How can I learn more?  Ask your doctor. He or she can give you the vaccine package insert or suggest other sources of information.  Call your local or state health department.  Contact the Centers for Disease Control and Prevention (CDC): ? Call 1-800-232-4636 (1-800-CDC-INFO) or ? Visit CDC's website at www.cdc.gov/vaccines CDC Tdap Vaccine VIS (06/03/13) This information is not intended to replace advice given to you by your  health care provider. Make sure you discuss any questions you have with your health care provider. Document Released: 09/26/2011 Document Revised: 12/16/2015 Document Reviewed: 12/16/2015 Elsevier Interactive Patient Education  2017 Elsevier Inc.  

## 2018-01-03 NOTE — Progress Notes (Signed)
    Date:  01/03/2018   Name:  Vanessa Mcneil   DOB:  [birth year]   MRN:  [id number]   Chief Complaint: No chief complaint on file.  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  She feels well, no complaints today. She will get influenza at work Needs Tdap Quantiferon gold done in February.    Review of Systems  Constitutional: Negative for fatigue and fever.  Respiratory: Negative for choking and shortness of breath.   Cardiovascular: Negative for chest pain, palpitations and leg swelling.  Gastrointestinal: Negative for constipation and diarrhea.  Musculoskeletal: Negative for arthralgias, back pain and joint swelling.  Skin: Negative for color change and pallor.  Allergic/Immunologic: Negative for environmental allergies.  Neurological: Negative for dizziness and headaches.  Psychiatric/Behavioral: Negative for sleep disturbance.    Patient Active Problem List   Diagnosis Date Noted  . 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 Anxiety 02/07/2015  . Family history of malignant melanoma 02/07/2015  . 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 Encounter for contraceptive management 02/07/2015    Allergies  Allergen Reactions  . Penicillins     Past Surgical History:  Procedure Laterality Date  . DILATION AND CURETTAGE OF UTERUS      Social History   Tobacco Use  . Smoking status: Never Smoker  . Smokeless tobacco: Never Used  Substance Use Topics  . Alcohol use: Yes    Alcohol/week: 0.0 standard drinks    Comment: rarely  . Drug use: No     Medication list has been reviewed and updated.  Current Meds  Medication Sig  . ALPRAZolam (XANAX) 0.25 MG tablet Take 1 tablet (0.25 mg total) by mouth 2 (two) times daily as needed for anxiety.  Marland Kitchen buPROPion (WELLBUTRIN XL) 300 MG 24 hr tablet TAKE 1 TABLET (300 MG TOTAL) BY MOUTH DAILY.  . valACYclovir (VALTREX) 1000 MG tablet Take 1 tablet (1,000 mg total) by mouth 2 (two) times daily. 2 tablets bid for 1 day  prn    PHQ 2/9 Scores 05/14/2017 05/10/2016  PHQ - 2 Score 0 0    Physical Exam  Constitutional: She is oriented to person, place, and time. She appears well-developed. No distress.  HENT:  Head: Normocephalic and atraumatic.  Neck: Normal range of motion. Neck supple. Carotid bruit is not present.  Cardiovascular: Normal rate, regular rhythm and normal heart sounds.  Pulmonary/Chest: Effort normal and breath sounds normal. No respiratory distress.  Musculoskeletal: Normal range of motion.  Lymphadenopathy:    She has no cervical adenopathy.  Neurological: She is alert and oriented to person, place, and time.  Skin: Skin is warm and dry. No rash noted.  Psychiatric: She has a normal mood and affect. Her behavior is normal. Thought content normal.  Nursing note and vitals reviewed.   BP 122/86 (BP Location: Right Arm, Patient Position: Sitting, Cuff Size: Normal)   Pulse 80   Ht 5\' 6"  (1.676 m)   Wt 180 lb (81.6 kg)   SpO2 100%   BMI 29.05 kg/m   Assessment and Plan: 1. Pre-employment examination Form completed  2. Need for diphtheria-tetanus-pertussis (Tdap) vaccine - Tdap vaccine greater than or equal to 7yo IM   Partially dictated using Animal nutritionist. Any errors are unintentional.  Bari Edward, MD [facility]  01/03/2018

## 2018-01-11 ENCOUNTER — Ambulatory Visit (INDEPENDENT_AMBULATORY_CARE_PROVIDER_SITE_OTHER): Payer: BC Managed Care – PPO | Admitting: Obstetrics and Gynecology

## 2018-01-11 ENCOUNTER — Encounter: Payer: Self-pay | Admitting: Obstetrics and Gynecology

## 2018-01-11 VITALS — BP 120/70 | HR 104 | Ht 67.0 in | Wt 179.0 lb

## 2018-01-11 DIAGNOSIS — F419 Anxiety disorder, unspecified: Secondary | ICD-10-CM

## 2018-01-11 DIAGNOSIS — Z1239 Encounter for other screening for malignant neoplasm of breast: Secondary | ICD-10-CM | POA: Diagnosis not present

## 2018-01-11 DIAGNOSIS — Z01411 Encounter for gynecological examination (general) (routine) with abnormal findings: Secondary | ICD-10-CM | POA: Diagnosis not present

## 2018-01-11 DIAGNOSIS — Z01419 Encounter for gynecological examination (general) (routine) without abnormal findings: Secondary | ICD-10-CM

## 2018-01-11 MED ORDER — ALPRAZOLAM 0.25 MG PO TABS: 0.2500 mg | ORAL_TABLET | Freq: Two times a day (BID) | ORAL | 0 refills | Status: DC | PRN

## 2018-01-11 MED ORDER — BUPROPION HCL ER (XL) 300 MG PO TB24: 300.0000 mg | ORAL_TABLET | Freq: Every day | ORAL | 10 refills | Status: DC

## 2018-01-11 NOTE — Progress Notes (Signed)
Gynecology Annual Exam  PCP: Vanessa Milan, MD  Chief Complaint:  Chief Complaint  Patient presents with  . Gynecologic Exam    History of Present Illness: Patient is a 42 y.o. [id number] presents for annual exam. The patient has no complaints today.   LMP: Patient's last menstrual period was 01/01/2018. Average Interval: regular, 28 days Duration of flow: 5 days Heavy Menses: no Clots: no Intermenstrual Bleeding: no Postcoital Bleeding: no Dysmenorrhea: no    The patient is sexually active. She currently uses vasectomy for contraception. She denies dyspareunia.  The patient does perform self breast exams.  There is no notable family history of breast or ovarian cancer in her family.  The patient wears seatbelts: yes.   The patient has regular exercise: no.    The patient reports current symptoms of depression.  Stable on current regimen  Review of Systems: Review of Systems  Constitutional: Negative for chills and fever.  HENT: Negative for congestion.   Respiratory: Negative for cough and shortness of breath.   Cardiovascular: Negative for chest pain and palpitations.  Gastrointestinal: Negative for abdominal pain, constipation, diarrhea, heartburn, nausea and vomiting.  Genitourinary: Negative for dysuria, frequency and urgency.  Skin: Negative for itching and rash.  Neurological: Negative for dizziness and headaches.  Endo/Heme/Allergies: Negative for polydipsia.  Psychiatric/Behavioral: Negative for depression.    Past Medical History:  History reviewed. No pertinent past medical history.  Past Surgical History:  Past Surgical History:  Procedure Laterality Date  . DILATION AND CURETTAGE OF UTERUS      Gynecologic History:  Patient's last menstrual period was 01/01/2018. Contraception: vasectomy Last Pap: Results were: 01/01/2017 NIL and HR HPV negative  Last mammogram: 06/19/2017 Results were: Elby Showers I  Obstetric History: [id number]  Family History:    Family History  Problem Relation Age of Onset  . Breast cancer Mother 49  . Skin cancer Mother   . Diabetes Father   . Throat cancer Father   . Lung cancer Paternal Grandfather 32    Social History:  Social History   Socioeconomic History  . Marital status: Married    Spouse name: Not on file  . Number of children: Not on file  . Years of education: Not on file  . Highest education level: Not on file  Occupational History  . Not on file  Social Needs  . Financial resource strain: Not on file  . Food insecurity:    Worry: Not on file    Inability: Not on file  . Transportation needs:    Medical: Not on file    Non-medical: Not on file  Tobacco Use  . Smoking status: Never Smoker  . Smokeless tobacco: Never Used  Substance and Sexual Activity  . Alcohol use: Yes    Alcohol/week: 0.0 standard drinks    Comment: rarely  . Drug use: No  . Sexual activity: Yes    Birth control/protection: Other-see comments    Comment: Vasectomy  Lifestyle  . Physical activity:    Days per week: Not on file    Minutes per session: Not on file  . Stress: Not on file  Relationships  . Social connections:    Talks on phone: Not on file    Gets together: Not on file    Attends religious service: Not on file    Active member of club or organization: Not on file    Attends meetings of clubs or organizations: Not on file    Relationship status:  Not on file  . Intimate partner violence:    Fear of current or ex partner: Not on file    Emotionally abused: Not on file    Physically abused: Not on file    Forced sexual activity: Not on file  Other Topics Concern  . Not on file  Social History Narrative  . Not on file    Allergies:  Allergies  Allergen Reactions  . Penicillins     Medications: Prior to Admission medications   Medication Sig Start Date End Date Taking? Authorizing Provider  ALPRAZolam (XANAX) 0.25 MG tablet Take 1 tablet (0.25 mg total) by mouth 2 (two) times  daily as needed for anxiety. 09/20/17  Yes Vena Austria, MD  buPROPion (WELLBUTRIN XL) 300 MG 24 hr tablet TAKE 1 TABLET (300 MG TOTAL) BY MOUTH DAILY. 06/07/17  Yes Vanessa Milan, MD  valACYclovir (VALTREX) 1000 MG tablet Take 1 tablet (1,000 mg total) by mouth 2 (two) times daily. 2 tablets bid for 1 day prn 11/14/17   Duanne Limerick, MD    Physical Exam Vitals: Blood pressure 120/70, pulse (!) 104, height 5\' 7"  (1.702 m), weight 179 lb (81.2 kg), last menstrual period 01/01/2018.  General: NAD HEENT: normocephalic, anicteric Thyroid: no enlargement, no palpable nodules Pulmonary: No increased work of breathing, CTAB Cardiovascular: RRR, distal pulses 2+ Breast: Breast symmetrical, no tenderness, no palpable nodules or masses, no skin or nipple retraction present, no nipple discharge.  No axillary or supraclavicular lymphadenopathy. Abdomen: NABS, soft, non-tender, non-distended.  Umbilicus without lesions.  No hepatomegaly, splenomegaly or masses palpable. No evidence of hernia  Genitourinary:  External: Normal external female genitalia.  Normal urethral meatus, normal Bartholin's and Skene's glands.    Vagina: Normal vaginal mucosa, no evidence of prolapse.    Cervix: Grossly normal in appearance, no bleeding  Uterus: Non-enlarged, mobile, normal contour.  No CMT  Adnexa: ovaries non-enlarged, no adnexal masses  Rectal: deferred  Lymphatic: no evidence of inguinal lymphadenopathy Extremities: no edema, erythema, or tenderness Neurologic: Grossly intact Psychiatric: mood appropriate, affect full  Female chaperone present for pelvic and breast  portions of the physical exam    Assessment: 42 y.o. [id number] routine annual exam  Plan: Problem List Items Addressed This Visit    None      1) Mammogram - recommend yearly screening mammogram.  Mammogram Is up to date   2) STI screening  was notoffered and therefore not obtained  3) ASCCP guidelines and rational discussed.   Patient opts for every 2 years screening interval  4) Contraception - the patient is currently using  vasectomy.  She is happy with her current form of contraception and plans to continue  5) Colonoscopy -- Screening recommended starting at age 78 for average risk individuals, age 31 for individuals deemed at increased risk (including African Americans) and recommended to continue until age 44.  For patient age 63-85 individualized approach is recommended.  Gold standard screening is via colonoscopy, Cologuard screening is an acceptable alternative for patient unwilling or unable to undergo colonoscopy.  "Colorectal cancer screening for average?risk adults: 2018 guideline update from the American Cancer Society"CA: A Cancer Journal for Clinicians: Sep 06, 2016   6) Routine healthcare maintenance including cholesterol, diabetes screening discussed managed by PCP  7) Return in about 1 year (around 01/12/2019) for annual.   Vena Austria, MD, [facility], [facility] 01/11/2018, 9:57 PM

## 2018-01-19 ENCOUNTER — Ambulatory Visit
Admission: EM | Admit: 2018-01-19 | Discharge: 2018-01-19 | Disposition: A | Discharge status: Home / Self Care | Payer: BC Managed Care – PPO | Attending: Family Medicine | Admitting: Family Medicine

## 2018-01-19 ENCOUNTER — Other Ambulatory Visit: Payer: Self-pay

## 2018-01-19 DIAGNOSIS — J020 Streptococcal pharyngitis: Secondary | ICD-10-CM

## 2018-01-19 HISTORY — DX: Anxiety disorder, unspecified: F41.9

## 2018-01-19 LAB — RAPID STREP SCREEN (MED CTR MEBANE ONLY): Streptococcus, Group A Screen (Direct): POSITIVE — AB

## 2018-01-19 MED ORDER — CEFDINIR 300 MG PO CAPS: 300.0000 mg | ORAL_CAPSULE | Freq: Two times a day (BID) | ORAL | 0 refills | Status: DC

## 2018-01-19 NOTE — ED Triage Notes (Addendum)
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 Yesterday had sore throat and fell cold/hot. Feels fatigued. Would like a strep test done

## 2018-01-19 NOTE — ED Provider Notes (Signed)
[facility]    CSN: [id number] Arrival date & time: 01/19/18  1105  History   Chief Complaint Chief Complaint  Patient presents with  . Sore Throat   HPI  42 year old female presents with concerns for strep throat.  Symptoms started yesterday.  She developed sore throat.  She now has fatigue and associated chills.  She has felt febrile but has not taken her temperature.  She has taken Tylenol with some improvement in her sore throat.  No known exacerbating factors.  She has had sick contacts with strep throat.  Symptoms are moderate in severity.  No other associated symptoms.  No other complaints.   PMH, Surgical Hx, Family Hx, Social History reviewed and updated as below.  Past Medical History:  Diagnosis Date  . Anxiety    Patient Active Problem List   Diagnosis Date Noted  . 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 Anxiety 02/07/2015  . Family history of malignant melanoma 02/07/2015  . 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 Encounter for contraceptive management 02/07/2015   Past Surgical History:  Procedure Laterality Date  . DILATION AND CURETTAGE OF UTERUS     OB History    Gravida  4   Para  2   Term      Preterm      AB  2   Living  2     SAB  2   TAB      Ectopic      Multiple      Live Births  2          Home Medications    Prior to Admission medications   Medication Sig Start Date End Date Taking? Authorizing Provider  ALPRAZolam (XANAX) 0.25 MG tablet Take 1 tablet (0.25 mg total) by mouth 2 (two) times daily as needed for anxiety. 01/11/18   Vena Austria, MD  buPROPion (WELLBUTRIN XL) 300 MG 24 hr tablet Take 1 tablet (300 mg total) by mouth daily. 01/11/18   Vena Austria, MD  cefdinir (OMNICEF) 300 MG capsule Take 1 capsule (300 mg total) by mouth 2 (two) times daily. 01/19/18   Tommie Sams, DO  valACYclovir (VALTREX) 1000 MG tablet Take 1 tablet (1,000 mg total) by mouth 2 (two) times daily. 2 tablets  bid for 1 day prn 11/14/17   Duanne Limerick, MD   Family History Family History  Problem Relation Age of Onset  . Breast cancer Mother 28  . Skin cancer Mother   . Diabetes Father   . Throat cancer Father   . Lung cancer Paternal Grandfather 52   Social History Social History   Tobacco Use  . Smoking status: Never Smoker  . Smokeless tobacco: Never Used  Substance Use Topics  . Alcohol use: Yes    Alcohol/week: 0.0 standard drinks    Comment: rarely  . Drug use: No   Allergies   Penicillins   Review of Systems Review of Systems  Constitutional: Positive for chills and fatigue. Negative for fever.  HENT: Positive for sore throat.    Physical Exam Triage Vital Signs ED Triage Vitals  Enc Vitals Group     BP 01/19/18 1115 113/73     Pulse Rate 01/19/18 1115 93     Resp 01/19/18 1115 16     Temp 01/19/18 1115 98 F (36.7 C)     Temp Source 01/19/18 1115 Oral     SpO2 01/19/18 1115 99 %  Weight 01/19/18 1116 179 lb 0.2 oz (81.2 kg)     Height 01/19/18 1116 5\' 7"  (1.702 m)     Head Circumference --      Peak Flow --      Pain Score 01/19/18 1116 0     Pain Loc --      Pain Edu? --      Excl. in GC? --    Updated Vital Signs BP 113/73 (BP Location: Left Arm)   Pulse 93   Temp 98 F (36.7 C) (Oral)   Resp 16   Ht 5\' 7"  (1.702 m)   Wt 81.2 kg   LMP 01/01/2018   SpO2 99%   BMI 28.04 kg/m   Visual Acuity Right Eye Distance:   Left Eye Distance:   Bilateral Distance:    Right Eye Near:   Left Eye Near:    Bilateral Near:     Physical Exam  Constitutional: She is oriented to person, place, and time. She appears well-developed. No distress.  HENT:  Head: Normocephalic and atraumatic.  Oropharynx with mild erythema  Cardiovascular: Normal rate and regular rhythm.  Pulmonary/Chest: Effort normal and breath sounds normal. She has no wheezes. She has no rales.  Neurological: She is alert and oriented to person, place, and time.  Psychiatric: She has a  normal mood and affect. Her behavior is normal.  Nursing note and vitals reviewed.  UC Treatments / Results  Labs (all labs ordered are listed, but only abnormal results are displayed) Labs Reviewed  RAPID STREP SCREEN ([facility] ONLY) - Abnormal; Notable for the following components:      Result Value   Streptococcus, Group A Screen (Direct) POSITIVE (*)    All other components within normal limits    EKG None  Radiology No results found.  Procedures Procedures (including critical care time)  Medications Ordered in UC Medications - No data to display  Initial Impression / Assessment and Plan / UC Course  I have reviewed the triage vital signs and the nursing notes.  Pertinent labs & imaging results that were available during my care of the patient were reviewed by me and considered in my medical decision making (see chart for details).    42 year old female presents with strep pharyngitis.  Treating with Omnicef given reports of allergy when she was a child.  Final Clinical Impressions(s) / UC Diagnoses   Final diagnoses:  Strep pharyngitis   Discharge Instructions   None    ED Prescriptions    Medication Sig Dispense Auth. Provider   cefdinir (OMNICEF) 300 MG capsule Take 1 capsule (300 mg total) by mouth 2 (two) times daily. 20 capsule Tommie Sams, DO     Controlled Substance Prescriptions [place] Controlled Substance Registry consulted? Not Applicable   Tommie Sams, DO 01/19/18 1204

## 2018-06-25 ENCOUNTER — Other Ambulatory Visit: Payer: Self-pay | Admitting: Obstetrics and Gynecology

## 2018-06-25 MED ORDER — ALPRAZOLAM 0.25 MG PO TABS: 0.2500 mg | ORAL_TABLET | Freq: Two times a day (BID) | ORAL | 0 refills | Status: DC | PRN

## 2018-06-25 NOTE — Telephone Encounter (Signed)
No future appointments scheduled, last seen 01/2018

## 2018-08-09 ENCOUNTER — Other Ambulatory Visit: Payer: Self-pay

## 2018-08-09 MED ORDER — VALACYCLOVIR HCL 1 G PO TABS: 1000.0000 mg | ORAL_TABLET | Freq: Two times a day (BID) | ORAL | 0 refills | Status: DC

## 2018-10-02 ENCOUNTER — Telehealth: Payer: Self-pay | Admitting: Internal Medicine

## 2018-10-02 NOTE — Telephone Encounter (Signed)
Needs to have a health form filled out for work, she was last seen 12/2017. Pt also may need tb test again. Does she needs to be seen for an OV to have form filled out? Please advise.

## 2018-10-02 NOTE — Telephone Encounter (Signed)
I looked in Media at the form we filled out last year. She needs to schedule a CPE as soon as possible for this and will receive TB Blood work then. Just label it as a work physical.  Thank you.

## 2018-10-03 ENCOUNTER — Encounter: Payer: Self-pay | Admitting: Internal Medicine

## 2018-10-15 ENCOUNTER — Ambulatory Visit: Payer: BC Managed Care – PPO | Admitting: Internal Medicine

## 2019-01-26 ENCOUNTER — Other Ambulatory Visit: Payer: Self-pay | Admitting: Obstetrics and Gynecology

## 2019-01-26 DIAGNOSIS — F419 Anxiety disorder, unspecified: Secondary | ICD-10-CM

## 2019-03-14 ENCOUNTER — Other Ambulatory Visit: Payer: Self-pay | Admitting: Internal Medicine

## 2019-03-14 DIAGNOSIS — Z1231 Encounter for screening mammogram for malignant neoplasm of breast: Secondary | ICD-10-CM

## 2019-04-02 ENCOUNTER — Other Ambulatory Visit: Payer: Self-pay

## 2019-04-02 ENCOUNTER — Ambulatory Visit
Admission: RE | Admit: 2019-04-02 | Discharge: 2019-04-02 | Disposition: A | Discharge status: Home / Self Care | Payer: BC Managed Care – PPO | Source: Ambulatory Visit | Attending: Internal Medicine | Admitting: Internal Medicine

## 2019-04-02 DIAGNOSIS — Z1231 Encounter for screening mammogram for malignant neoplasm of breast: Secondary | ICD-10-CM | POA: Diagnosis not present

## 2019-07-01 ENCOUNTER — Other Ambulatory Visit: Payer: Self-pay

## 2019-07-15 ENCOUNTER — Other Ambulatory Visit: Payer: Self-pay

## 2019-08-28 ENCOUNTER — Encounter: Payer: Self-pay | Admitting: Obstetrics and Gynecology

## 2019-08-28 ENCOUNTER — Other Ambulatory Visit: Payer: Self-pay

## 2019-08-28 ENCOUNTER — Ambulatory Visit (INDEPENDENT_AMBULATORY_CARE_PROVIDER_SITE_OTHER): Payer: BC Managed Care – PPO | Admitting: Obstetrics and Gynecology

## 2019-08-28 VITALS — Ht 66.0 in | Wt 167.0 lb

## 2019-08-28 DIAGNOSIS — F411 Generalized anxiety disorder: Secondary | ICD-10-CM

## 2019-08-28 MED ORDER — ALPRAZOLAM 0.25 MG PO TABS: 0.2500 mg | ORAL_TABLET | Freq: Two times a day (BID) | ORAL | 0 refills | Status: DC | PRN

## 2019-08-28 NOTE — Progress Notes (Signed)
I connected with Vanessa Mcneil on 09/02/19 at  3:30 PM EDT by telephone and verified that I am speaking with the correct person using two identifiers.   I discussed the limitations, risks, security and privacy concerns of performing an evaluation and management service by telephone and the availability of in person appointments. I also discussed with the patient that there may be a patient responsible charge related to this service. The patient expressed understanding and agreed to proceed.  The patient was at home I spoke with the patient from my workstation phone The names of people involved in this encounter were: Vanessa Mcneil , and Vanessa Mcneil   Obstetrics & Gynecology Office Visit   Chief Complaint:  Chief Complaint  Patient presents with  . Follow-up    Anxiety/depression (going through divorce)    History of Present Illness: The patient is a 44 y.o. female presenting follow up for symptoms of anxiety.  The patient is currently taking Xanax 0.25mg  prn for the management of her symptoms.  She has had any recent situational stressors (finalizing divorce, 2020 pandemic).  She reports overall doing very well but has occasional breakthrough anxiety, not frequent enough where she feels she needs to be on a daily regimen.  She denies anhedonia, day time somnolence, insomnia, risk taking behavior, irritability, social anxiety, agorophobia, feelings of guilt, feelings of worthlessness, suicidal ideation, homicidal ideation, auditory hallucinations and visual hallucinations. Symptoms have remained unchanged since last visit.     The patient does have a pre-existing history of depression and anxiety.  She  does not a prior history of suicide attempts.    Review of Systems: Review of systems negative unless noted in HPI  Past Medical History:  Past Medical History:  Diagnosis Date  . Anxiety     Past Surgical History:  Past Surgical History:  Procedure Laterality Date  .  DILATION AND CURETTAGE OF UTERUS      Gynecologic History: Patient's last menstrual period was 08/10/2019 (exact date).  Obstetric History: [id number]  Family History:  Family History  Problem Relation Age of Onset  . Breast cancer Mother 79  . Skin cancer Mother   . Diabetes Father   . Throat cancer Father   . Lung cancer Paternal Grandfather 72  . Breast cancer Cousin        mat cousin    Social History:  Social History   Socioeconomic History  . Marital status: Divorced    Spouse name: Not on file  . Number of children: Not on file  . Years of education: Not on file  . Highest education level: Not on file  Occupational History  . Not on file  Tobacco Use  . Smoking status: Never Smoker  . Smokeless tobacco: Never Used  Substance and Sexual Activity  . Alcohol use: Yes    Alcohol/week: 0.0 standard drinks    Comment: rarely  . Drug use: No  . Sexual activity: Yes    Birth control/protection: None  Other Topics Concern  . Not on file  Social History Narrative  . Not on file   Social Determinants of Health   Financial Resource Strain:   . Difficulty of Paying Living Expenses:   Food Insecurity:   . Worried About Charity fundraiser in the Last Year:   . Arboriculturist in the Last Year:   Transportation Needs:   . Film/video editor (Medical):   Marland Kitchen Lack of Transportation (Non-Medical):   Physical  Activity:   . Days of Exercise per Week:   . Minutes of Exercise per Session:   Stress:   . Feeling of Stress :   Social Connections:   . Frequency of Communication with Friends and Family:   . Frequency of Social Gatherings with Friends and Family:   . Attends Religious Services:   . Active Member of Clubs or Organizations:   . Attends Banker Meetings:   Marland Kitchen Marital Status:   Intimate Partner Violence:   . Fear of Current or Ex-Partner:   . Emotionally Abused:   Marland Kitchen Physically Abused:   . Sexually Abused:     Allergies:  Allergies    Allergen Reactions  . Penicillins     Medications: Prior to Admission medications   Medication Sig Start Date End Date Taking? Authorizing Provider  ALPRAZolam (XANAX) 0.25 MG tablet Take 1 tablet (0.25 mg total) by mouth 2 (two) times daily as needed for anxiety. Patient not taking: Reported on 08/28/2019 06/25/18   Vena Austria, MD  buPROPion (WELLBUTRIN XL) 300 MG 24 hr tablet TAKE 1 TABLET BY MOUTH EVERY DAY Patient not taking: Reported on 08/28/2019 01/27/19   Vena Austria, MD    Physical Exam Vitals: There were no vitals filed for this visit. Patient's last menstrual period was 08/10/2019 (exact date).  No physical exam as this was a remote telephone visit to promote social distancing during the current COVID-19 Pandemic   GAD 7 : Generalized Anxiety Score 08/28/2019  Nervous, Anxious, on Edge 1  Control/stop worrying 1  Worry too much - different things 0  Trouble relaxing 1  Restless 1  Easily annoyed or irritable 2  Afraid - awful might happen 0  Total GAD 7 Score 6  Anxiety Difficulty Somewhat difficult    Depression screen [facility] 2/9 08/28/2019 05/14/2017 05/10/2016  Decreased Interest 0 0 0  Down, Depressed, Hopeless 0 0 0  PHQ - 2 Score 0 0 0  Altered sleeping 1 - -  Tired, decreased energy 1 - -  Change in appetite 0 - -  Feeling bad or failure about yourself  0 - -  Trouble concentrating 0 - -  Moving slowly or fidgety/restless 1 - -  Suicidal thoughts 0 - -  PHQ-9 Score 3 - -  Difficult doing work/chores Somewhat difficult - -    Depression screen [facility] 2/9 08/28/2019 05/14/2017 05/10/2016  Decreased Interest 0 0 0  Down, Depressed, Hopeless 0 0 0  PHQ - 2 Score 0 0 0  Altered sleeping 1 - -  Tired, decreased energy 1 - -  Change in appetite 0 - -  Feeling bad or failure about yourself  0 - -  Trouble concentrating 0 - -  Moving slowly or fidgety/restless 1 - -  Suicidal thoughts 0 - -  PHQ-9 Score 3 - -  Difficult doing work/chores Somewhat  difficult - -     Assessment: 44 y.o. [id number] Follow up generalized anxiety  Plan: Problem List Items Addressed This Visit    None    Visit Diagnoses    Generalized anxiety disorder    -  Primary   Relevant Medications   ALPRAZolam (XANAX) 0.25 MG tablet      1) Generalized anxiety - Finalizing divoce:so large situational component.  Continue Xanax prn  2) Telephone time 17:63min  3) Return in about 3 months (around 11/28/2019) for annual.  Refill xanax  2) Thyroid and B12 screen has not been obtained previously  3)  Return in about 3 months (around 11/28/2019) for annual.    Vena Austria, MD, [facility], [facility] 08/28/2019, 3:36 PM

## 2019-12-03 ENCOUNTER — Ambulatory Visit: Payer: BC Managed Care – PPO | Admitting: Obstetrics and Gynecology

## 2019-12-18 ENCOUNTER — Ambulatory Visit: Payer: BC Managed Care – PPO | Admitting: Advanced Practice Midwife

## 2020-01-07 ENCOUNTER — Other Ambulatory Visit: Payer: Self-pay

## 2020-01-07 ENCOUNTER — Ambulatory Visit (INDEPENDENT_AMBULATORY_CARE_PROVIDER_SITE_OTHER): Payer: BC Managed Care – PPO | Admitting: Obstetrics and Gynecology

## 2020-01-07 ENCOUNTER — Other Ambulatory Visit (HOSPITAL_COMMUNITY)
Admission: RE | Admit: 2020-01-07 | Discharge: 2020-01-07 | Disposition: A | Discharge status: Home / Self Care | Payer: BC Managed Care – PPO | Source: Ambulatory Visit | Attending: Obstetrics and Gynecology | Admitting: Obstetrics and Gynecology

## 2020-01-07 ENCOUNTER — Encounter: Payer: Self-pay | Admitting: Obstetrics and Gynecology

## 2020-01-07 VITALS — BP 108/73 | HR 91 | Ht 67.0 in | Wt 171.0 lb

## 2020-01-07 DIAGNOSIS — Z1239 Encounter for other screening for malignant neoplasm of breast: Secondary | ICD-10-CM | POA: Diagnosis not present

## 2020-01-07 DIAGNOSIS — Z124 Encounter for screening for malignant neoplasm of cervix: Secondary | ICD-10-CM | POA: Insufficient documentation

## 2020-01-07 DIAGNOSIS — Z01419 Encounter for gynecological examination (general) (routine) without abnormal findings: Secondary | ICD-10-CM | POA: Diagnosis not present

## 2020-01-07 DIAGNOSIS — Z1211 Encounter for screening for malignant neoplasm of colon: Secondary | ICD-10-CM | POA: Diagnosis not present

## 2020-01-07 NOTE — Progress Notes (Signed)
Gynecology Annual Exam  PCP: Reubin Milan, MD  Chief Complaint:  Chief Complaint  Patient presents with  . Gynecologic Exam    History of Present Illness: Patient is a 44 y.o. [id number] presents for annual exam. The patient has no complaints today.   LMP: Patient's last menstrual period was 12/19/2019. Average Interval: regular, 28 days Duration of flow: 4 days Heavy Menses: no Clots: no Intermenstrual Bleeding: no Postcoital Bleeding: no Dysmenorrhea: no   The patient is sexually active. She currently uses vasectomy for contraception. She denies dyspareunia.  The patient does perform self breast exams.  There is no notable family history of breast or ovarian cancer in her family.  The patient wears seatbelts: yes.   The patient has regular exercise: not asked.     The patient reports no current symptoms of depression.    Review of Systems: ROS  Past Medical History:  Patient Active Problem List   Diagnosis Date Noted  . 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 Anxiety 02/07/2015  . Family history of malignant melanoma 02/07/2015  . 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 Encounter for contraceptive management 02/07/2015    Past Surgical History:  Past Surgical History:  Procedure Laterality Date  . DILATION AND CURETTAGE OF UTERUS      Gynecologic History:  Patient's last menstrual period was 12/19/2019. Contraception: vasectomy Last Pap: Results were: 01/01/2017 NIL and HR HPV negative  Last mammogram: 04/01/2020 Results were: Elby Showers I  Obstetric History: [id number]  Family History:  Family History  Problem Relation Age of Onset  . Breast cancer Mother 52  . Skin cancer Mother   . Diabetes Father   . Throat cancer Father   . Lung cancer Paternal Grandfather 19  . Breast cancer Cousin        mat cousin    Social History:  Social History   Socioeconomic History  . Marital status: Divorced    Spouse name: Not on file  . Number of  children: Not on file  . Years of education: Not on file  . Highest education level: Not on file  Occupational History  . Not on file  Tobacco Use  . Smoking status: Never Smoker  . Smokeless tobacco: Never Used  Vaping Use  . Vaping Use: Never used  Substance and Sexual Activity  . Alcohol use: Yes    Alcohol/week: 0.0 standard drinks    Comment: rarely  . Drug use: No  . Sexual activity: Yes    Birth control/protection: None  Other Topics Concern  . Not on file  Social History Narrative  . Not on file   Social Determinants of Health   Financial Resource Strain:   . Difficulty of Paying Living Expenses: Not on file  Food Insecurity:   . Worried About Programme researcher, broadcasting/film/video in the Last Year: Not on file  . Ran Out of Food in the Last Year: Not on file  Transportation Needs:   . Lack of Transportation (Medical): Not on file  . Lack of Transportation (Non-Medical): Not on file  Physical Activity:   . Days of Exercise per Week: Not on file  . Minutes of Exercise per Session: Not on file  Stress:   . Feeling of Stress : Not on file  Social Connections:   . Frequency of Communication with Friends and Family: Not on file  . Frequency of Social Gatherings with Friends and Family: Not on file  . Attends Religious Services:  Not on file  . Active Member of Clubs or Organizations: Not on file  . Attends Banker Meetings: Not on file  . Marital Status: Not on file  Intimate Partner Violence:   . Fear of Current or Ex-Partner: Not on file  . Emotionally Abused: Not on file  . Physically Abused: Not on file  . Sexually Abused: Not on file    Allergies:  Allergies  Allergen Reactions  . Penicillins     Medications: Prior to Admission medications   Medication Sig Start Date End Date Taking? Authorizing Provider  ALPRAZolam (XANAX) 0.25 MG tablet Take 1 tablet (0.25 mg total) by mouth 2 (two) times daily as needed for anxiety. 08/28/19   Vena Austria, MD    buPROPion (WELLBUTRIN XL) 300 MG 24 hr tablet TAKE 1 TABLET BY MOUTH EVERY DAY Patient not taking: Reported on 08/28/2019 01/27/19   Vena Austria, MD    Physical Exam Vitals: Blood pressure 108/73, pulse 91, height 5\' 7"  (1.702 m), weight 171 lb (77.6 kg), last menstrual period 12/19/2019.  General: NAD HEENT: normocephalic, anicteric Thyroid: no enlargement, no palpable nodules Pulmonary: No increased work of breathing, CTAB Cardiovascular: RRR, distal pulses 2+ Breast: Breast symmetrical, no tenderness, no palpable nodules or masses, no skin or nipple retraction present, no nipple discharge.  No axillary or supraclavicular lymphadenopathy. Abdomen: NABS, soft, non-tender, non-distended.  Umbilicus without lesions.  No hepatomegaly, splenomegaly or masses palpable. No evidence of hernia  Genitourinary:  External: Normal external female genitalia.  Normal urethral meatus, normal Bartholin's and Skene's glands. Small hyperpigmented area in posterior fourchette    Vagina: Normal vaginal mucosa, no evidence of prolapse.    Cervix: Grossly normal in appearance, no bleeding  Uterus: Non-enlarged, mobile, normal contour.  No CMT  Adnexa: ovaries non-enlarged, no adnexal masses  Rectal: deferred  Lymphatic: no evidence of inguinal lymphadenopathy Extremities: no edema, erythema, or tenderness Neurologic: Grossly intact Psychiatric: mood appropriate, affect full  Female chaperone present for pelvic and breast  portions of the physical exam  Physical Exam Genitourinary:         Assessment: 44 y.o. 44 routine annual exam  Plan: Problem List Items Addressed This Visit    None    Visit Diagnoses    Encounter for gynecological examination without abnormal finding    -  Primary   Screening for malignant neoplasm of cervix       Relevant Orders   Cytology - PAP   Breast screening       Relevant Orders   MM 3D SCREEN BREAST BILATERAL   Colon cancer screening        Relevant Orders   Ambulatory referral to Gastroenterology      1) Mammogram - recommend yearly screening mammogram.  Mammogram Was ordered today   2) STI screening  was notoffered and therefore not obtained  3) ASCCP guidelines and rational discussed.  Patient opts for every 3 years screening interval  4) Contraception - the patient is currently using  vasectomy.  She is happy with her current form of contraception and plans to continue  5) Colonoscopy -- Screening recommended starting at age 40 for average risk individuals - ordered  6) Routine healthcare maintenance including cholesterol, diabetes screening discussed managed by PCP  7) Return in about 1 year (around 01/06/2021) for annual.   01/08/2021, MD, [facility] Austria [facility], [facility] 01/07/2020, 11:34 AM

## 2020-01-07 NOTE — Patient Instructions (Signed)
[facility] [address] [place] [address]  [facility] [place]  [address] [place] [address]  Phone: [phone]  

## 2020-01-08 LAB — CYTOLOGY - PAP
Adequacy: ABSENT
Comment: NEGATIVE
Diagnosis: NEGATIVE
High risk HPV: NEGATIVE

## 2020-01-19 ENCOUNTER — Encounter: Payer: Self-pay | Admitting: *Deleted

## 2020-03-02 ENCOUNTER — Other Ambulatory Visit: Payer: Self-pay | Admitting: Obstetrics and Gynecology

## 2020-03-02 DIAGNOSIS — F419 Anxiety disorder, unspecified: Secondary | ICD-10-CM

## 2020-03-02 NOTE — Telephone Encounter (Signed)
Please advise 

## 2020-04-30 ENCOUNTER — Ambulatory Visit: Payer: Self-pay

## 2020-04-30 NOTE — Telephone Encounter (Signed)
Noted   Pt will go to UC or get a home test.  KP

## 2020-04-30 NOTE — Telephone Encounter (Signed)
Patient called stating that she has 2 children that have tested positive for COVID-19.  She states that her symptoms started on Saturday with throat and nasal congestion no fever.  She has off and on sensation of chest heaviness. No SOB. She is vaccinated.  She has not been tested. She is seeking advice.  Per protocol call placed to office. Office recommendation use UC or buy home test kit and treat with OTC medications. Care advice was read to patient.  Reason for Disposition . [1] COVID-19 infection suspected by caller or triager AND [2] mild symptoms (cough, fever, or others) AND [3] has not gotten tested yet  Answer Assessment - Initial Assessment Questions 1. COVID-19 EXPOSURE: "Please describe how you were exposed to someone with a COVID-19 infection."     Son and daughter 2. PLACE of CONTACT: "Where were you when you were exposed to COVID-19?" (e.g., home, school, medical waiting room; which city?)    home 3. TYPE of CONTACT: "How much contact was there?" (e.g., sitting next to, live in same house, work in same office, same building)    Live in same home 4. DURATION of CONTACT: "How long were you in contact with the COVID-19 patient?" (e.g., a few seconds, passed by person, a few minutes, 15 minutes or longer, live with the patient)    daily 5. MASK: "Were you wearing a mask?" "Was the other person wearing a mask?" Note: wearing a mask reduces the risk of an otherwise close contact.    no 6. DATE of CONTACT: "When did you have contact with a COVID-19 patient?" (e.g., how many days ago)    Tuesday for daughter and today for son 7. COMMUNITY SPREAD: "Are there lots of cases of COVID-19 (community spread) where you live?" (See public health department website, if unsure)       yes 8. SYMPTOMS: "Do you have any symptoms?" (e.g., fever, cough, breathing difficulty, loss of taste or smell)     Nasal congestion, scratchy throat, heavy chest,  9. VACCINE: "Have you gotten the COVID-19 vaccine?"  If Yes ask: "Which one, how many shots, when did you get it?"    yes 10. PREGNANCY OR POSTPARTUM: "Is there any chance you are pregnant?" "When was your last menstrual period?" "Did you deliver in the last 2 weeks?"       no 11. HIGH RISK: "Do you have any heart or lung problems?" "Do you have a weak immune system?" (e.g., heart failure, COPD, asthma, HIV positive, chemotherapy, renal failure, diabetes mellitus, sickle cell anemia, obesity)      none 12. TRAVEL: "Have you traveled out of the country recently?" If Yes, ask: "When and where?" Also ask about out-of-state travel, since the CDC has identified some high-risk cities for community spread in the [place]. Note: Travel becomes less relevant if there is widespread community transmission where the patient lives.     n/A  Answer Assessment - Initial Assessment Questions 1. COVID-19 DIAGNOSIS: "Who made your COVID-19 diagnosis?" "Was it confirmed by a positive lab test?" If not diagnosed by a HCP, ask "Are there lots of cases (community spread) where you live?" Note: See public health department website, if unsure.     Not yet tested 2. COVID-19 EXPOSURE: "Was there any known exposure to COVID before the symptoms began?" CDC Definition of close contact: within 6 feet (2 meters) for a total of 15 minutes or more over a 24-hour period.     At home tho children 3. ONSET: "When  did the COVID-19 symptoms start?"     Saturday 4. WORST SYMPTOM: "What is your worst symptom?" (e.g., cough, fever, shortness of breath, muscle aches)     Chest tightness 5. COUGH: "Do you have a cough?" If Yes, ask: "How bad is the cough?"       no 6. FEVER: "Do you have a fever?" If Yes, ask: "What is your temperature, how was it measured, and when did it start?"     no 7. RESPIRATORY STATUS: "Describe your breathing?" (e.g., shortness of breath, wheezing, unable to speak)      no 8. BETTER-SAME-WORSE: "Are you getting better, staying the same or getting worse compared to  yesterday?"  If getting worse, ask, "In what way?"     same 9. HIGH RISK DISEASE: "Do you have any chronic medical problems?" (e.g., asthma, heart or lung disease, weak immune system, obesity, etc.)    none 10. VACCINE: "Have you gotten the COVID-19 vaccine?" If Yes ask: "Which one, how many shots, when did you get it?"       yes 11. PREGNANCY: "Is there any chance you are pregnant?" "When was your last menstrual period?"       No husband vasctomy 12. OTHER SYMPTOMS: "Do you have any other symptoms?"  (e.g., chills, fatigue, headache, loss of smell or taste, muscle pain, sore throat; new loss of smell or taste especially support the diagnosis of COVID-19)       Nasal congestion change in taste and smell  Protocols used: CORONAVIRUS (COVID-19) DIAGNOSED OR SUSPECTED-A-AH, CORONAVIRUS (COVID-19) EXPOSURE-A-AH

## 2020-07-07 ENCOUNTER — Other Ambulatory Visit: Payer: Self-pay | Admitting: Obstetrics and Gynecology

## 2020-07-07 MED ORDER — ALPRAZOLAM 0.25 MG PO TABS: 0.2500 mg | ORAL_TABLET | Freq: Two times a day (BID) | ORAL | 0 refills | Status: DC | PRN

## 2020-07-08 ENCOUNTER — Other Ambulatory Visit: Payer: Self-pay

## 2020-07-08 ENCOUNTER — Ambulatory Visit
Admission: RE | Admit: 2020-07-08 | Discharge: 2020-07-08 | Disposition: A | Discharge status: Home / Self Care | Payer: BC Managed Care – PPO | Source: Ambulatory Visit | Attending: Obstetrics and Gynecology | Admitting: Obstetrics and Gynecology

## 2020-07-08 DIAGNOSIS — Z1231 Encounter for screening mammogram for malignant neoplasm of breast: Secondary | ICD-10-CM | POA: Insufficient documentation

## 2020-07-08 DIAGNOSIS — Z1239 Encounter for other screening for malignant neoplasm of breast: Secondary | ICD-10-CM

## 2021-02-19 ENCOUNTER — Other Ambulatory Visit: Payer: Self-pay | Admitting: Obstetrics and Gynecology

## 2021-02-19 DIAGNOSIS — F419 Anxiety disorder, unspecified: Secondary | ICD-10-CM

## 2021-04-22 ENCOUNTER — Other Ambulatory Visit: Payer: Self-pay | Admitting: Obstetrics and Gynecology

## 2021-04-22 DIAGNOSIS — F419 Anxiety disorder, unspecified: Secondary | ICD-10-CM

## 2021-04-26 ENCOUNTER — Other Ambulatory Visit: Payer: Self-pay | Admitting: Obstetrics and Gynecology

## 2021-04-26 DIAGNOSIS — F419 Anxiety disorder, unspecified: Secondary | ICD-10-CM

## 2021-05-01 ENCOUNTER — Other Ambulatory Visit: Payer: Self-pay | Admitting: Obstetrics and Gynecology

## 2021-05-01 DIAGNOSIS — F419 Anxiety disorder, unspecified: Secondary | ICD-10-CM

## 2021-05-03 ENCOUNTER — Ambulatory Visit: Payer: BC Managed Care – PPO | Admitting: Internal Medicine

## 2021-05-03 ENCOUNTER — Other Ambulatory Visit: Payer: Self-pay

## 2021-05-03 ENCOUNTER — Encounter: Payer: Self-pay | Admitting: Internal Medicine

## 2021-05-03 VITALS — BP 118/78 | HR 90 | Resp 95 | Ht 67.0 in | Wt 178.0 lb

## 2021-05-03 DIAGNOSIS — N951 Menopausal and female climacteric states: Secondary | ICD-10-CM | POA: Diagnosis not present

## 2021-05-03 DIAGNOSIS — F419 Anxiety disorder, unspecified: Secondary | ICD-10-CM

## 2021-05-03 MED ORDER — BUPROPION HCL ER (XL) 300 MG PO TB24: 300.0000 mg | ORAL_TABLET | Freq: Every day | ORAL | 1 refills | Status: DC

## 2021-05-03 NOTE — Progress Notes (Signed)
Date:  05/03/2021   Name:  Vanessa Mcneil   DOB:  November 05, 1975   MRN:  [id number]   Chief Complaint: Anxiety and premenopausal symptoms  Anxiety Presents for follow-up visit. Symptoms include irritability, muscle tension and nervous/anxious behavior. Patient reports no chest pain, dizziness or shortness of breath. Symptoms occur occasionally. The severity of symptoms is mild.   Compliance with medications: she takes Welbutrin 300 mg daily and rarely xanax.   Hot flashes - she had a hot flash the other day for the first time.  She was not sure what it was.  Her periods are still regular but getting shorter.  Lab Results  Component Value Date   NA 144 05/14/2017   K 4.6 05/14/2017   CO2 23 05/14/2017   GLUCOSE 86 05/14/2017   BUN 13 05/14/2017   CREATININE 0.79 05/14/2017   CALCIUM 9.1 05/14/2017   GFRNONAA 92 05/14/2017   Lab Results  Component Value Date   CHOL 152 05/10/2016   HDL 67 05/10/2016   LDLCALC 72 05/10/2016   TRIG 65 05/10/2016   CHOLHDL 2.3 05/10/2016   Lab Results  Component Value Date   TSH 3.060 05/14/2017   No results found for: HGBA1C Lab Results  Component Value Date   WBC 5.4 05/14/2017   HGB 13.3 05/14/2017   HCT 41.7 05/14/2017   MCV 88 05/14/2017   PLT 272 05/14/2017   Lab Results  Component Value Date   ALT 19 05/14/2017   AST 19 05/14/2017   ALKPHOS 68 05/14/2017   BILITOT 0.3 05/14/2017   No results found for: 25OHVITD2, 25OHVITD3, VD25OH   Review of Systems  Constitutional:  Positive for diaphoresis and irritability. Negative for chills, fatigue, fever and unexpected weight change.  Respiratory:  Negative for cough, chest tightness and shortness of breath.   Cardiovascular:  Negative for chest pain and leg swelling.  Genitourinary:  Negative for menstrual problem.  Neurological:  Negative for dizziness, light-headedness and headaches.  Psychiatric/Behavioral:  Negative for dysphoric mood and sleep disturbance. The patient is  nervous/anxious.    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  Current Meds  Medication Sig   ALPRAZolam (XANAX) 0.25 MG tablet Take 1 tablet (0.25 mg total) by mouth 2 (two) times daily as needed for anxiety.   [DISCONTINUED] buPROPion (WELLBUTRIN XL) 300 MG 24 hr tablet TAKE 1 TABLET BY MOUTH EVERY DAY    PHQ 2/9 Scores 05/03/2021 01/07/2020 08/28/2019 05/14/2017  PHQ - 2 Score 0 0 0 0  PHQ- 9 Score 0 0 3 -    GAD 7 : Generalized Anxiety Score 05/03/2021 01/07/2020 08/28/2019  Nervous, Anxious, on Edge 1 1 1   Control/stop worrying 1  0 1  Worry too much - different things 1 0 0  Trouble relaxing 1 1 1   Restless 0 1 1  Easily annoyed or irritable 2 1 2   Afraid - awful might happen 0 0 0  Total GAD 7 Score 6 4 6   Anxiety Difficulty Somewhat difficult Not difficult at all Somewhat difficult    BP Readings from Last 3 Encounters:  05/03/21 118/78  01/07/20 108/73  01/19/18 113/73    Physical Exam Vitals and nursing note reviewed.  Constitutional:      General: She is not in acute distress.    Appearance: Normal appearance. She is well-developed.  HENT:     Head: Normocephalic and atraumatic.  Cardiovascular:     Rate and Rhythm: Normal rate and regular rhythm.     Pulses: Normal pulses.      Heart sounds: No murmur heard. Pulmonary:     Effort: Pulmonary effort is normal. No respiratory distress.     Breath sounds: No wheezing or rhonchi.  Musculoskeletal:     Cervical back: Normal range of motion.     Right lower leg: No edema.     Left lower leg: No edema.  Skin:    General: Skin is warm and dry.     Findings: No rash.  Neurological:     Mental Status: She is alert and oriented to person, place, and time.  Psychiatric:        Mood and Affect: Mood normal.        Behavior: Behavior normal.    Wt Readings from Last 3 Encounters:  05/03/21 178 lb (80.7 kg)  01/07/20 171 lb (77.6 kg)  08/28/19 167 lb (75.8 kg)    BP 118/78    Pulse 90    Resp (!) 95    Ht 5\' 7"  (1.702 m)    Wt 178 lb (80.7 kg)    LMP 04/05/2021 (Exact Date) Comment: 2-3 day periods.   BMI 27.88 kg/m   Assessment and Plan: 1. Anxiety Doing well on Bupropion.  Would like to taper off if possible. After discussion, she has some family issues currently so will continue current dose. Call for xanax if needed.   - buPROPion (WELLBUTRIN XL) 300 MG 24 hr tablet; Take 1 tablet (300 mg total) by mouth daily.  Dispense: 90 tablet; Refill: 1  2. Hot flushes, perimenopausal May be the start of menopause. Recommend regular exercise, healthy diet. Discussed the role of HRT and herbal supplements. Pap and mammogram are up to date.  Recommend follow up for CPX and labs in 6 months.  Partially dictated using 01/09/20. Any errors are unintentional.  08/30/19, MD [facility]  05/03/2021

## 2021-08-08 ENCOUNTER — Other Ambulatory Visit: Payer: Self-pay | Admitting: Internal Medicine

## 2021-08-08 DIAGNOSIS — Z1231 Encounter for screening mammogram for malignant neoplasm of breast: Secondary | ICD-10-CM

## 2021-08-15 ENCOUNTER — Ambulatory Visit: Payer: BC Managed Care – PPO

## 2021-09-26 ENCOUNTER — Ambulatory Visit: Payer: BC Managed Care – PPO | Admitting: Internal Medicine

## 2021-09-26 ENCOUNTER — Other Ambulatory Visit: Payer: Self-pay | Admitting: Internal Medicine

## 2021-09-26 ENCOUNTER — Encounter: Payer: Self-pay | Admitting: Internal Medicine

## 2021-09-26 VITALS — BP 124/80 | HR 104 | Ht 67.0 in | Wt 172.0 lb

## 2021-09-26 DIAGNOSIS — Z111 Encounter for screening for respiratory tuberculosis: Secondary | ICD-10-CM | POA: Diagnosis not present

## 2021-09-26 DIAGNOSIS — F419 Anxiety disorder, unspecified: Secondary | ICD-10-CM | POA: Diagnosis not present

## 2021-09-26 MED ORDER — ALPRAZOLAM 0.25 MG PO TABS: 0.2500 mg | ORAL_TABLET | Freq: Two times a day (BID) | ORAL | 0 refills | Status: DC | PRN

## 2021-09-26 MED ORDER — BUPROPION HCL ER (XL) 300 MG PO TB24: 300.0000 mg | ORAL_TABLET | Freq: Every day | ORAL | 1 refills | Status: DC

## 2021-09-26 NOTE — Progress Notes (Signed)
Date:  09/26/2021   Name:  Vanessa Mcneil   DOB:  26-Aug-1975   MRN:  [id number]   Chief Complaint: Anxiety  Anxiety Presents for follow-up visit. Symptoms include nervous/anxious behavior. Patient reports no chest pain, dizziness or shortness of breath. Symptoms occur occasionally. The severity of symptoms is moderate.   Compliance with medications is 76-100% (bupropion and Ativan).   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   Lab Results  Component Value Date   NA 144 05/14/2017   K 4.6 05/14/2017   CO2 23 05/14/2017   GLUCOSE 86 05/14/2017   BUN 13 05/14/2017   CREATININE 0.79 05/14/2017   CALCIUM 9.1 05/14/2017   GFRNONAA 92 05/14/2017   Lab Results  Component Value Date   CHOL 152 05/10/2016   HDL 67 05/10/2016   LDLCALC 72 05/10/2016   TRIG 65 05/10/2016   CHOLHDL 2.3 05/10/2016   Lab Results  Component Value Date   TSH 3.060 05/14/2017   No results found for: "HGBA1C" Lab Results  Component Value Date   WBC 5.4 05/14/2017   HGB 13.3 05/14/2017   HCT 41.7 05/14/2017   MCV 88 05/14/2017   PLT 272 05/14/2017   Lab Results  Component Value Date   ALT 19 05/14/2017   AST 19 05/14/2017   ALKPHOS 68 05/14/2017   BILITOT 0.3 05/14/2017   No results found for: "25OHVITD2", "25OHVITD3", "VD25OH"   Review of Systems  Constitutional:  Negative for appetite change.  Respiratory:  Negative for chest tightness and shortness of breath.   Cardiovascular:  Negative for chest pain and leg swelling.  Gastrointestinal:        Recent Norovirus infection resolved  Neurological:  Negative for dizziness and headaches.  Psychiatric/Behavioral:  Negative for dysphoric mood and sleep disturbance. The patient is nervous/anxious.     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  Current Meds  Medication Sig   ALPRAZolam (XANAX) 0.25 MG tablet Take 1 tablet (0.25 mg total) by mouth 2 (two) times daily as needed for anxiety.   buPROPion (WELLBUTRIN XL) 300 MG 24 hr tablet Take 1 tablet (300 mg total) by mouth daily.       09/26/2021    2:09 PM 05/03/2021    3:48 PM 01/07/2020   10:49 AM 08/28/2019    3:29 PM  GAD 7 : Generalized Anxiety Score  Nervous, Anxious, on Edge 0 1 1 1   Control/stop worrying 0 1 0 1  Worry too much - different things 0 1 0 0  Trouble relaxing 0 1 1 1   Restless 0 0 1 1  Easily annoyed or irritable 0 2 1 2   Afraid - awful might happen 0 0 0 0  Total GAD 7 Score 0 6 4 6   Anxiety Difficulty Not difficult at all Somewhat difficult Not difficult at all Somewhat difficult       09/26/2021    2:09 PM  Depression screen PHQ 2/9  Decreased Interest 0  Down, Depressed, Hopeless 0  PHQ - 2 Score 0  Altered sleeping 0  Tired, decreased energy 0  Change in appetite 0  Feeling bad or failure about yourself  0  Trouble concentrating 0  Moving slowly or fidgety/restless 0  Suicidal thoughts 0  PHQ-9 Score 0  Difficult doing work/chores Not difficult at all    BP Readings from Last 3 Encounters:  09/26/21 124/80  05/03/21 118/78  01/07/20 108/73    Physical Exam Vitals and nursing note reviewed.  Constitutional:      General: She is not in acute  distress.    Appearance: Normal appearance. She is well-developed.  HENT:     Head: Normocephalic and atraumatic.  Cardiovascular:     Rate and Rhythm: Normal rate and regular rhythm.     Heart sounds: No murmur heard. Pulmonary:     Effort: Pulmonary effort is normal. No respiratory distress.     Breath sounds: No wheezing or rhonchi.  Musculoskeletal:     Cervical back: Normal range of motion.     Right lower leg: No edema.     Left lower leg: No edema.  Lymphadenopathy:     Cervical: No cervical adenopathy.  Skin:    General: Skin is warm and dry.     Findings: No rash.  Neurological:     Mental Status: She is alert and oriented to person, place, and time.  Psychiatric:        Mood and Affect: Mood normal.        Behavior: Behavior normal.     Wt Readings from Last 3 Encounters:  09/26/21 172 lb (78 kg)  05/03/21 178 lb (80.7 kg)  01/07/20 171 lb (77.6 kg)    BP 124/80   Pulse (!) 104   Ht 5\' 7"  (1.702 m)   Wt 172 lb (78 kg)   SpO2 97%   BMI 26.94 kg/m   Assessment and Plan: 1. Anxiety Well controlled with bupropion daily and PRN Xanax Continue current regimen. - buPROPion (WELLBUTRIN XL) 300 MG 24 hr tablet; Take 1 tablet (300 mg total) by mouth daily.  Dispense: 90 tablet; Refill: 1 - ALPRAZolam (XANAX) 0.25 MG tablet; Take 1 tablet (0.25 mg total) by mouth 2 (two) times daily as needed for anxiety.  Dispense: 30 tablet; Refill: 0  2. Screening for tuberculosis Needed for new job at [employer] - [employer]   Partially dictated using 01/09/20. Any errors are unintentional.  , MD [facility]  09/26/2021

## 2021-09-28 ENCOUNTER — Ambulatory Visit
Admission: RE | Admit: 2021-09-28 | Discharge: 2021-09-28 | Disposition: A | Discharge status: Home / Self Care | Payer: BC Managed Care – PPO | Source: Ambulatory Visit | Attending: Internal Medicine | Admitting: Internal Medicine

## 2021-09-28 DIAGNOSIS — Z1231 Encounter for screening mammogram for malignant neoplasm of breast: Secondary | ICD-10-CM | POA: Diagnosis not present

## 2021-09-29 LAB — QUANTIFERON-TB GOLD PLUS
QuantiFERON Mitogen Value: >10 IU/mL
QuantiFERON Nil Value: 0.25 IU/mL
QuantiFERON TB1 Ag Value: 0.09 IU/mL
QuantiFERON TB2 Ag Value: 0.11 IU/mL
QuantiFERON-TB Gold Plus: NEGATIVE

## 2021-11-03 ENCOUNTER — Encounter: Payer: BC Managed Care – PPO | Admitting: Internal Medicine

## 2021-12-27 ENCOUNTER — Encounter: Payer: Self-pay | Admitting: Internal Medicine

## 2021-12-27 ENCOUNTER — Ambulatory Visit: Payer: BC Managed Care – PPO | Admitting: Internal Medicine

## 2021-12-27 ENCOUNTER — Ambulatory Visit: Payer: Self-pay

## 2021-12-27 VITALS — BP 104/78 | HR 86 | Temp 97.9°F | Ht 67.0 in | Wt 172.0 lb

## 2021-12-27 DIAGNOSIS — J069 Acute upper respiratory infection, unspecified: Secondary | ICD-10-CM | POA: Diagnosis not present

## 2021-12-27 DIAGNOSIS — R21 Rash and other nonspecific skin eruption: Secondary | ICD-10-CM

## 2021-12-27 NOTE — Telephone Encounter (Addendum)
  Chief Complaint: cough, wheezing Symptoms: productive cough, moderate SOB, Frequency: onset 2 weeks ago Pertinent Negatives: Patient denies fever, chest pain Disposition: [] ED /[] Urgent Care (no appt availability in office) / [x] Appointment(In office/virtual)/ []  [facility]/ [] Home Care/ [] Refused Recommended Disposition /[] [facility]/ []  Follow-up with PCP Additional Notes: pt given appt for 1120 with PCP- pt self tested negative for covid Reason for Disposition  Wheezing is present  Answer Assessment - Initial Assessment Questions 1. ONSET: "When did the cough begin?"      2 weeks  2. SEVERITY: "How bad is the cough today?"      Occasional cough 3. SPUTUM: "Describe the color of your sputum" (none, dry cough; clear, white, yellow, green)     Clear to white 4. HEMOPTYSIS: "Are you coughing up any blood?" If so ask: "How much?" (flecks, streaks, tablespoons, etc.)     no 5. DIFFICULTY BREATHING: "Are you having difficulty breathing?" If Yes, ask: "How bad is it?" (e.g., mild, moderate, severe)    - MILD: No SOB at rest, mild SOB with walking, speaks normally in sentences, can lie down, no retractions, pulse < 100.    - MODERATE: SOB at rest, SOB with minimal exertion and prefers to sit, cannot lie down flat, speaks in phrases, mild retractions, audible wheezing, pulse 100-120.    - SEVERE: Very SOB at rest, speaks in single words, struggling to breathe, sitting hunched forward, retractions, pulse > 120      Moderate  6. FEVER: "Do you have a fever?" If Yes, ask: "What is your temperature, how was it measured, and when did it start?"     no 7. CARDIAC HISTORY: "Do you have any history of heart disease?" (e.g., heart attack, congestive heart failure)      no 8. LUNG HISTORY: "Do you have any history of lung disease?"  (e.g., pulmonary embolus, asthma, emphysema)     no 9. PE RISK FACTORS: "Do you have a history of blood clots?" (or: recent major surgery, recent  prolonged travel, bedridden)     no 10. OTHER SYMPTOMS: "Do you have any other symptoms?" (e.g., runny nose, wheezing, chest pain)       Wheezing, cough, 11. PREGNANCY: "Is there any chance you are pregnant?" "When was your last menstrual period?"       N/a 12. TRAVEL: "Have you traveled out of the country in the last month?" (e.g., travel history, exposures)       no  Protocols used: Cough - Acute Productive-A-AH

## 2021-12-27 NOTE — Progress Notes (Signed)
Date:  12/27/2021   Name:  Vanessa Mcneil   DOB:  [birth year]   MRN:  [id number]   Chief Complaint: Cough  Cough This is a new problem. The current episode started 1 to 4 weeks ago. The problem has been waxing and waning. The cough is Productive of sputum. Associated symptoms include a rash, a sore throat and wheezing (only a few occasions in the evening which were short lived). Pertinent negatives include no chills, ear pain, fever, headaches, postnasal drip or shortness of breath.  Rash This is a chronic problem. The current episode started more than 1 month ago. The problem is unchanged. Location: left ring finger. The rash is characterized by itchiness, scaling and redness. Associated with: constantly using hand sanitizer at work. Associated symptoms include coughing and a sore throat. Pertinent negatives include no fatigue, fever or shortness of breath.    Lab Results  Component Value Date   NA 144 05/14/2017   K 4.6 05/14/2017   CO2 23 05/14/2017   GLUCOSE 86 05/14/2017   BUN 13 05/14/2017   CREATININE 0.79 05/14/2017   CALCIUM 9.1 05/14/2017   GFRNONAA 92 05/14/2017   Lab Results  Component Value Date   CHOL 152 05/10/2016   HDL 67 05/10/2016   LDLCALC 72 05/10/2016   TRIG 65 05/10/2016   CHOLHDL 2.3 05/10/2016   Lab Results  Component Value Date   TSH 3.060 05/14/2017   No results found for: "HGBA1C" Lab Results  Component Value Date   WBC 5.4 05/14/2017   HGB 13.3 05/14/2017   HCT 41.7 05/14/2017   MCV 88 05/14/2017   PLT 272 05/14/2017   Lab Results  Component Value Date   ALT 19 05/14/2017   AST 19 05/14/2017   ALKPHOS 68 05/14/2017   BILITOT 0.3 05/14/2017   No results found for: "25OHVITD2", "25OHVITD3", "VD25OH"   Review of Systems  Constitutional:  Negative for chills, diaphoresis, fatigue and fever.  HENT:  Positive for sore throat. Negative for ear pain and postnasal drip.   Respiratory:  Positive for cough and wheezing (only a few occasions  in the evening which were short lived). Negative for shortness of breath.   Skin:  Positive for rash.  Neurological:  Negative for headaches.  Psychiatric/Behavioral:  Negative for dysphoric mood and sleep disturbance. The patient is not nervous/anxious.     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   Anxiety 02/07/2015   Family history of malignant melanoma 02/07/2015   Oral herpes simplex, not currently active 02/07/2015   Encounter for contraceptive management 02/07/2015    Allergies  Allergen Reactions   Penicillins     Past Surgical History:  Procedure Laterality Date   DILATION AND CURETTAGE OF UTERUS      Social History   Tobacco Use   Smoking status: Never   Smokeless tobacco: Never  Vaping Use   Vaping Use: Never used  Substance Use Topics   Alcohol use: Yes    Alcohol/week: 0.0 standard drinks of alcohol    Comment: rarely   Drug use: No     Medication list has been reviewed and updated.  Current Meds  Medication Sig   ALPRAZolam (XANAX) 0.25 MG tablet Take 1 tablet (0.25 mg total) by mouth 2 (two) times daily as needed for anxiety.   buPROPion (WELLBUTRIN XL) 300 MG 24 hr tablet Take 1 tablet (300 mg total) by  mouth daily.       09/26/2021    2:09 PM 05/03/2021    3:48 PM 01/07/2020   10:49 AM 08/28/2019    3:29 PM  GAD 7 : Generalized Anxiety Score  Nervous, Anxious, on Edge 0 1 1 1   Control/stop worrying 0 1 0 1  Worry too much - different things 0 1 0 0  Trouble relaxing 0 1 1 1   Restless 0 0 1 1  Easily annoyed or irritable 0 2 1 2   Afraid - awful might happen 0 0 0 0  Total GAD 7 Score 0 6 4 6   Anxiety Difficulty Not difficult at all Somewhat difficult Not difficult at all Somewhat difficult       09/26/2021    2:09 PM 05/03/2021    3:48 PM 01/07/2020   10:48 AM  Depression screen PHQ 2/9  Decreased Interest 0 0 0  Down, Depressed, Hopeless 0 0 0  PHQ - 2  Score 0 0 0  Altered sleeping 0 0 0  Tired, decreased energy 0 0 0  Change in appetite 0 0 0  Feeling bad or failure about yourself  0 0 0  Trouble concentrating 0 0 0  Moving slowly or fidgety/restless 0 0 0  Suicidal thoughts 0 0 0  PHQ-9 Score 0 0 0  Difficult doing work/chores Not difficult at all Not difficult at all     BP Readings from Last 3 Encounters:  12/27/21 104/78  09/26/21 124/80  05/03/21 118/78    Physical Exam Vitals and nursing note reviewed.  Constitutional:      General: She is not in acute distress.    Appearance: Normal appearance. She is well-developed.  HENT:     Head: Normocephalic and atraumatic.     Right Ear: Tympanic membrane and ear canal normal.     Left Ear: Tympanic membrane and ear canal normal.     Nose:     Right Sinus: No maxillary sinus tenderness or frontal sinus tenderness.     Left Sinus: No maxillary sinus tenderness or frontal sinus tenderness.     Mouth/Throat:     Pharynx: Posterior oropharyngeal erythema (with some scant yellow drainage) present.  Cardiovascular:     Rate and Rhythm: Normal rate and regular rhythm.  Pulmonary:     Effort: Pulmonary effort is normal. No respiratory distress.     Breath sounds: Normal breath sounds. No wheezing or rhonchi.  Musculoskeletal:     Cervical back: Normal range of motion.  Lymphadenopathy:     Cervical: No cervical adenopathy.  Skin:    General: Skin is warm and dry.     Findings: Rash present. Rash is scaling.     Comments: Scaling red rash under wedding ring  Neurological:     Mental Status: She is alert and oriented to person, place, and time.  Psychiatric:        Mood and Affect: Mood normal.        Behavior: Behavior normal.     Wt Readings from Last 3 Encounters:  12/27/21 172 lb (78 kg)  09/26/21 172 lb (78 kg)  05/03/21 178 lb (80.7 kg)    BP 104/78   Pulse 86   Temp 97.9 F (36.6 C) (Oral)   Ht 5\' 7"  (1.702 m)   Wt 172 lb (78 kg)   SpO2 94%   BMI 26.94  kg/m   Assessment and Plan: 1. Upper respiratory tract infection, unspecified type Exam is essentially normal,  normal lung sounds and clinically minimally symptomatic. Possible trigger for cough/wheeze is PND seen on exam Recommend a course of Flonase NS  2. Rash of finger Due to contact irritant Should not wear rings at work until healed - use cortisone cream bid. Consider not wearing rings at work if symptoms recur.   Partially dictated using Animal nutritionist. Any errors are unintentional.  Bari Edward, MD [facility]  12/27/2021

## 2022-01-18 ENCOUNTER — Ambulatory Visit: Payer: Self-pay

## 2022-01-18 NOTE — Telephone Encounter (Signed)
Chief Complaint: SOB w/exertion Symptoms: dizziness off and on, SOB while walking, wheezing Frequency: Yesterday Pertinent Negatives: Patient denies fever, chest pain Disposition: [] ED /[] Urgent Care (no appt availability in office) / [x] Appointment(In office/virtual)/ []  [facility]/ [] Home Care/ [] Refused Recommended Disposition /[] [facility]/ []  Follow-up with PCP Additional Notes: Says dizziness happening more frequently, SOB off and on.    Summary: feeling lightheaded/dificulty breathing.   Pt stated she saw PCP for chest cold and cough; however, this has possibly moved into her chest, stated feeling lightheaded and experiencing difficulty breathing.   Pt seeking clinical advise.      Reason for Disposition  [1] MODERATE longstanding difficulty breathing (e.g., speaks in phrases, SOB even at rest, pulse 100-120) AND [2] SAME as normal  Answer Assessment - Initial Assessment Questions 1. RESPIRATORY STATUS: "Describe your breathing?" (e.g., wheezing, shortness of breath, unable to speak, severe coughing)      Can't take in a good breath, wheezing 2. ONSET: "When did this breathing problem begin?"      3 days ago 3. PATTERN "Does the difficult breathing come and go, or has it been constant since it started?"      Comes and goes 4. SEVERITY: "How bad is your breathing?" (e.g., mild, moderate, severe)    - MILD: No SOB at rest, mild SOB with walking, speaks normally in sentences, can lie down, no retractions, pulse < 100.    - MODERATE: SOB at rest, SOB with minimal exertion and prefers to sit, cannot lie down flat, speaks in phrases, mild retractions, audible wheezing, pulse 100-120.    - SEVERE: Very SOB at rest, speaks in single words, struggling to breathe, sitting hunched forward, retractions, pulse > 120      Mild 5. RECURRENT SYMPTOM: "Have you had difficulty breathing before?" If Yes, ask: "When was the last time?" and "What happened that time?"       No 6. CARDIAC HISTORY: "Do you have any history of heart disease?" (e.g., heart attack, angina, bypass surgery, angioplasty)      N/A 7. LUNG HISTORY: "Do you have any history of lung disease?"  (e.g., pulmonary embolus, asthma, emphysema)     No 8. CAUSE: "What do you think is causing the breathing problem?"      Unsure, getting over a chest cold 9. OTHER SYMPTOMS: "Do you have any other symptoms? (e.g., dizziness, runny nose, cough, chest pain, fever)     Congested nose, dizziness periodically 10. O2 SATURATION MONITOR:  "Do you use an oxygen saturation monitor (pulse oximeter) at home?" If Yes, ask: "What is your reading (oxygen level) today?" "What is your usual oxygen saturation reading?" (e.g., 95%)       N/A 11. PREGNANCY: "Is there any chance you are pregnant?" "When was your last menstrual period?"       N/A 12. TRAVEL: "Have you traveled out of the country in the last month?" (e.g., travel history, exposures)       N/A  Protocols used: Breathing Difficulty-A-AH

## 2022-01-19 ENCOUNTER — Encounter: Payer: Self-pay | Admitting: Internal Medicine

## 2022-01-19 ENCOUNTER — Ambulatory Visit (INDEPENDENT_AMBULATORY_CARE_PROVIDER_SITE_OTHER): Payer: BC Managed Care – PPO | Admitting: Internal Medicine

## 2022-01-19 VITALS — BP 124/78 | HR 100 | Ht 67.0 in | Wt 178.0 lb

## 2022-01-19 DIAGNOSIS — L309 Dermatitis, unspecified: Secondary | ICD-10-CM

## 2022-01-19 DIAGNOSIS — J0100 Acute maxillary sinusitis, unspecified: Secondary | ICD-10-CM

## 2022-01-19 MED ORDER — AZITHROMYCIN 250 MG PO TABS: ORAL_TABLET | ORAL | 0 refills | Status: AC

## 2022-01-19 NOTE — Progress Notes (Signed)
Date:  01/19/2022   Name:  Vanessa Mcneil   DOB:  [birth year]   MRN:  [id number]   Chief Complaint: Shortness of Breath and Dizziness  Shortness of Breath This is a recurrent problem. The current episode started 1 to 4 weeks ago. Associated symptoms include rhinorrhea. Pertinent negatives include no chest pain, fever, rash, sore throat or wheezing. Associated symptoms comments: Ear Congestion, Tightness in chest when she breaths, hears rumbling in her lungs..  Dizziness Associated symptoms include fatigue. Pertinent negatives include no chest pain, chills, fever, rash or sore throat.    Lab Results  Component Value Date   NA 144 05/14/2017   K 4.6 05/14/2017   CO2 23 05/14/2017   GLUCOSE 86 05/14/2017   BUN 13 05/14/2017   CREATININE 0.79 05/14/2017   CALCIUM 9.1 05/14/2017   GFRNONAA 92 05/14/2017   Lab Results  Component Value Date   CHOL 152 05/10/2016   HDL 67 05/10/2016   LDLCALC 72 05/10/2016   TRIG 65 05/10/2016   CHOLHDL 2.3 05/10/2016   Lab Results  Component Value Date   TSH 3.060 05/14/2017   No results found for: "HGBA1C" Lab Results  Component Value Date   WBC 5.4 05/14/2017   HGB 13.3 05/14/2017   HCT 41.7 05/14/2017   MCV 88 05/14/2017   PLT 272 05/14/2017   Lab Results  Component Value Date   ALT 19 05/14/2017   AST 19 05/14/2017   ALKPHOS 68 05/14/2017   BILITOT 0.3 05/14/2017   No results found for: "25OHVITD2", "25OHVITD3", "VD25OH"   Review of Systems  Constitutional:  Positive for fatigue. Negative for chills and fever.  HENT:  Positive for postnasal drip, rhinorrhea and sinus pressure. Negative for sore throat.   Respiratory:  Positive for chest tightness and shortness of breath. Negative for wheezing.   Cardiovascular:  Negative for chest pain.  Skin:  Negative for rash.  Neurological:  Positive for dizziness.    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  Current Meds  Medication Sig   ALPRAZolam (XANAX) 0.25 MG tablet Take 1 tablet (0.25 mg total) by mouth 2 (two) times daily as needed for anxiety.   buPROPion (WELLBUTRIN XL) 300 MG 24 hr tablet Take 1 tablet (300 mg total) by mouth daily.       12/27/2021   11:34 AM 09/26/2021    2:09 PM 05/03/2021    3:48 PM 01/07/2020   10:49 AM  GAD 7 : Generalized Anxiety Score  Nervous, Anxious, on Edge 1 0 1 1  Control/stop worrying 1 0 1 0  Worry too much - different things 1 0 1 0  Trouble relaxing 1 0 1 1  Restless 0 0 0  1  Easily annoyed or irritable 1 0 2 1  Afraid - awful might happen 0 0 0 0  Total GAD 7 Score 5 0 6 4  Anxiety Difficulty Not difficult at all Not difficult at all Somewhat difficult Not difficult at all       12/27/2021   11:33 AM 09/26/2021    2:09 PM 05/03/2021    3:48 PM  Depression screen PHQ 2/9  Decreased Interest 0 0 0  Down, Depressed, Hopeless 0 0 0  PHQ - 2 Score 0 0 0  Altered sleeping 1 0 0  Tired, decreased energy 1 0 0  Change in appetite 0 0 0  Feeling bad or failure about yourself  0 0 0  Trouble concentrating 0 0 0  Moving slowly or fidgety/restless 0 0 0  Suicidal thoughts 0 0 0  PHQ-9 Score 2 0 0  Difficult doing work/chores Not difficult at all Not difficult at all Not difficult at all    BP Readings from Last 3 Encounters:  01/19/22 124/78  12/27/21  104/78  09/26/21 124/80    Physical Exam Constitutional:      Appearance: She is well-developed.  HENT:     Right Ear: Ear canal and external ear normal. Tympanic membrane is not erythematous or retracted.     Left Ear: Ear canal and external ear normal. Tympanic membrane is not erythematous or retracted.     Nose:     Right Sinus: Maxillary sinus tenderness present. No frontal sinus tenderness.     Left Sinus: Maxillary sinus tenderness present. No frontal sinus tenderness.  Cardiovascular:     Rate and Rhythm: Normal rate and regular rhythm.     Heart sounds: Normal heart sounds.  Pulmonary:     Effort: Pulmonary effort is normal.     Breath sounds: Normal breath sounds. No wheezing, rhonchi or rales.  Lymphadenopathy:     Cervical: No cervical adenopathy.  Neurological:     Mental Status: She is alert and oriented to person, place, and time.     Wt Readings from Last 3 Encounters:  01/19/22 178 lb (80.7 kg)  12/27/21 172 lb (78 kg)  09/26/21 172 lb (78 kg)    BP 124/78   Pulse 100   Ht 5\' 7"  (1.702 m)   Wt 178 lb (80.7 kg)   SpO2 96%   BMI 27.88 kg/m   Assessment and Plan: 1. Acute non-recurrent maxillary sinusitis Continue Mucinex for symptom relief Follow up if symptoms worsen - azithromycin (ZITHROMAX Z-PAK) 250 MG tablet; UAD  Dispense: 6 each; Refill: 0  2. Dermatitis Flare up under wedding ring from recent use of hand sanitizer Continue use of topical cortisone   Partially dictated using Editor, commissioning. Any errors are unintentional.  Halina Maidens, MD [facility]  01/19/2022

## 2022-01-20 ENCOUNTER — Encounter: Payer: BC Managed Care – PPO | Admitting: Internal Medicine

## 2022-01-20 NOTE — Progress Notes (Deleted)
Date:  01/20/2022   Name:  Vanessa Mcneil   DOB:  [birth year]   MRN:  [id number]   Chief Complaint: No chief complaint on file. Vanessa Mcneil is a 46 y.o. female who presents today for her Complete Annual Exam. She feels {DESC; WELL/FAIRLY WELL/POORLY:18703}. She reports exercising ***. She reports she is sleeping {DESC; WELL/FAIRLY WELL/POORLY:18703}. Breast complaints ***.  Mammogram: 09/2021 DEXA: none Pap smear: 12/2019 neg/neg Colonoscopy: none  Health Maintenance Due  Topic Date Due   Hepatitis C Screening  Never done    Immunization History  Administered Date(s) Administered   Hep B, Unspecified 10/05/1997   Influenza Inj Mdck Quad With Preservative 01/29/2018   Influenza,inj,Quad PF,6+ Mos 02/05/2017   Influenza-Unspecified 02/05/2017   PFIZER Comirnaty(Gray Top)Covid-19 Tri-Sucrose Vaccine 06/07/2019, 06/28/2019   PFIZER(Purple Top)SARS-COV-2 Vaccination 06/07/2019, 06/28/2019   PPD Test 08/16/2015   Tdap 01/03/2018    Anxiety Presents for follow-up visit. Symptoms include nervous/anxious behavior. Patient reports no chest pain, dizziness, palpitations or shortness of breath. Symptoms occur occasionally.   Compliance with medications is 76-100% (bupropion).  URI  This is a new problem. The current episode started in the past 7 days. The problem has been gradually improving. Associated symptoms include congestion. Pertinent negatives include no abdominal pain, chest pain, coughing, diarrhea, dysuria, headaches, rash, vomiting or wheezing. Treatments tried: on Zpak.    Lab Results  Component Value Date   NA 144 05/14/2017   K 4.6 05/14/2017   CO2 23 05/14/2017   GLUCOSE 86 05/14/2017   BUN 13 05/14/2017   CREATININE 0.79 05/14/2017   CALCIUM 9.1 05/14/2017   GFRNONAA 92 05/14/2017   Lab Results  Component Value Date   CHOL 152 05/10/2016   HDL 67 05/10/2016   LDLCALC 72 05/10/2016   TRIG 65 05/10/2016   CHOLHDL 2.3 05/10/2016   Lab Results  Component  Value Date   TSH 3.060 05/14/2017   No results found for: "HGBA1C" Lab Results  Component Value Date   WBC 5.4 05/14/2017   HGB 13.3 05/14/2017   HCT 41.7 05/14/2017   MCV 88 05/14/2017   PLT 272 05/14/2017   Lab Results  Component Value Date   ALT 19 05/14/2017   AST 19 05/14/2017   ALKPHOS 68 05/14/2017   BILITOT 0.3 05/14/2017   No results found for: "25OHVITD2", "25OHVITD3", "VD25OH"   Review of Systems  Constitutional:  Negative for chills, fatigue and fever.  HENT:  Positive for congestion and sinus pressure. Negative for hearing loss, tinnitus, trouble swallowing and voice change.   Eyes:  Negative for visual disturbance.  Respiratory:  Negative for cough, chest tightness, shortness of breath and wheezing.   Cardiovascular:  Negative for chest pain, palpitations and leg swelling.  Gastrointestinal:  Negative for abdominal pain, constipation, diarrhea and vomiting.  Endocrine: Negative for polydipsia and polyuria.  Genitourinary:  Negative for dysuria, frequency, genital sores, vaginal bleeding and vaginal discharge.  Musculoskeletal:  Negative for arthralgias, gait problem and joint swelling.  Skin:  Negative for color change and rash.  Neurological:  Negative for dizziness, tremors, light-headedness and headaches.  Hematological:  Negative for adenopathy. Does not bruise/bleed easily.  Psychiatric/Behavioral:  Negative for dysphoric mood and sleep disturbance. The patient is nervous/anxious.     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  No outpatient medications have been marked as taking for the 01/20/22 encounter (Appointment) with Reubin Milan, MD.       12/27/2021   11:34 AM 09/26/2021    2:09 PM 05/03/2021    3:48 PM 01/07/2020   10:49 AM  GAD 7 : Generalized Anxiety Score  Nervous, Anxious, on Edge 1 0 1 1  Control/stop worrying 1 0 1 0  Worry too much - different things 1 0 1 0  Trouble relaxing 1 0 1 1  Restless 0 0 0 1  Easily annoyed or irritable 1 0 2 1  Afraid - awful might happen 0 0 0 0  Total GAD 7 Score 5 0 6 4  Anxiety Difficulty Not difficult at all Not difficult at all Somewhat difficult Not difficult at all       12/27/2021   11:33 AM 09/26/2021    2:09 PM 05/03/2021    3:48 PM  Depression screen PHQ 2/9  Decreased Interest 0 0 0  Down, Depressed, Hopeless 0 0 0  PHQ - 2 Score 0 0 0  Altered sleeping 1 0 0  Tired, decreased energy 1 0 0  Change in appetite 0 0 0  Feeling bad or failure about yourself  0 0 0  Trouble concentrating 0 0 0  Moving slowly or fidgety/restless 0 0 0  Suicidal thoughts 0 0 0  PHQ-9 Score 2 0 0  Difficult doing work/chores Not difficult at all Not difficult at all Not difficult at all    BP Readings from Last 3 Encounters:  01/19/22 124/78  12/27/21 104/78  09/26/21 124/80    Physical Exam Vitals and nursing note reviewed.  Constitutional:      General: She is not in acute distress.    Appearance: She is well-developed.  HENT:     Head: Normocephalic and atraumatic.     Right Ear: Tympanic membrane and ear canal normal.     Left Ear: Tympanic membrane and ear canal normal.     Nose:     Right Sinus: No maxillary sinus tenderness.     Left Sinus: No maxillary sinus tenderness.  Eyes:     General: No scleral icterus.        Right eye: No discharge.        Left eye: No discharge.     Conjunctiva/sclera: Conjunctivae normal.  Neck:     Thyroid: No thyromegaly.     Vascular: No carotid bruit.  Cardiovascular:     Rate and Rhythm: Normal rate and regular rhythm.     Pulses: Normal pulses.     Heart sounds: Normal heart sounds.  Pulmonary:     Effort: Pulmonary effort is normal. No respiratory distress.     Breath sounds: No wheezing.  Chest:  Breasts:    Right: No mass, nipple discharge, skin change or tenderness.     Left: No  mass, nipple discharge, skin change or tenderness.  Abdominal:     General: Bowel sounds are normal.     Palpations: Abdomen is soft.     Tenderness: There is no abdominal tenderness.  Musculoskeletal:     Cervical back: Normal range of motion. No erythema.     Right lower leg: No edema.     Left lower leg: No edema.  Lymphadenopathy:     Cervical: No cervical adenopathy.  Skin:    General: Skin is warm and dry.     Findings: No rash.  Neurological:     Mental Status: She is alert and oriented to person, place, and time.     Cranial Nerves: No cranial nerve deficit.     Sensory: No sensory deficit.     Deep Tendon Reflexes: Reflexes are normal and symmetric.  Psychiatric:        Attention and Perception: Attention normal.        Mood and Affect: Mood normal.     Wt Readings from Last 3 Encounters:  01/19/22 178 lb (80.7 kg)  12/27/21 172 lb (78 kg)  09/26/21 172 lb (78 kg)    There were no vitals taken for this visit.  Assessment and Plan:

## 2022-02-01 ENCOUNTER — Ambulatory Visit: Payer: BC Managed Care – PPO

## 2022-05-03 ENCOUNTER — Other Ambulatory Visit: Payer: Self-pay | Admitting: Internal Medicine

## 2022-05-03 DIAGNOSIS — F419 Anxiety disorder, unspecified: Secondary | ICD-10-CM

## 2022-05-12 ENCOUNTER — Encounter: Payer: Self-pay | Admitting: Internal Medicine

## 2022-05-12 ENCOUNTER — Ambulatory Visit (INDEPENDENT_AMBULATORY_CARE_PROVIDER_SITE_OTHER): Payer: BC Managed Care – PPO | Admitting: Internal Medicine

## 2022-05-12 VITALS — BP 110/72 | HR 99 | Ht 67.0 in | Wt 175.2 lb

## 2022-05-12 DIAGNOSIS — Z0000 Encounter for general adult medical examination without abnormal findings: Secondary | ICD-10-CM

## 2022-05-12 DIAGNOSIS — Z1231 Encounter for screening mammogram for malignant neoplasm of breast: Secondary | ICD-10-CM

## 2022-05-12 DIAGNOSIS — Z1211 Encounter for screening for malignant neoplasm of colon: Secondary | ICD-10-CM | POA: Diagnosis not present

## 2022-05-12 DIAGNOSIS — Z1159 Encounter for screening for other viral diseases: Secondary | ICD-10-CM

## 2022-05-12 DIAGNOSIS — F419 Anxiety disorder, unspecified: Secondary | ICD-10-CM

## 2022-05-12 NOTE — Progress Notes (Signed)
Date:  05/12/2022   Name:  Vanessa Mcneil   DOB:  October 25, 1975   MRN:  [id number]   Chief Complaint: Annual Exam Vanessa Mcneil is a 47 y.o. female who presents today for her Complete Annual Exam. She feels well. She reports exercising. She reports she is sleeping well. Breast complaints - none.  Mammogram: 6.2023 DEXA: none Pap smear: 12/2019 neg/neg Colonoscopy: none  Health Maintenance Due  Topic Date Due   Hepatitis C Screening  Never done   COVID-19 Vaccine (5 - 2023-24 season) 12/09/2021    Immunization History  Administered Date(s) Administered   Hep B, Unspecified 10/05/1997   Influenza Inj Mdck Quad With Preservative 01/29/2018   Influenza,inj,Quad PF,6+ Mos 02/05/2017   Influenza-Unspecified 02/05/2017, 02/09/2022   PFIZER Comirnaty(Gray Top)Covid-19 Tri-Sucrose Vaccine 06/07/2019, 06/28/2019   PFIZER(Purple Top)SARS-COV-2 Vaccination 06/07/2019, 06/28/2019   PPD Test 08/16/2015   Tdap 01/03/2018    Anxiety Presents for follow-up visit. Patient reports no chest pain, dizziness, nervous/anxious behavior, palpitations or shortness of breath. Symptoms occur occasionally.   Compliance with medications is 76-100%.    Lab Results  Component Value Date   NA 144 05/14/2017   K 4.6 05/14/2017   CO2 23 05/14/2017   GLUCOSE 86 05/14/2017   BUN 13 05/14/2017   CREATININE 0.79 05/14/2017   CALCIUM 9.1 05/14/2017   GFRNONAA 92 05/14/2017   Lab Results  Component Value Date   CHOL 152 05/10/2016   HDL 67 05/10/2016   LDLCALC 72 05/10/2016   TRIG 65 05/10/2016   CHOLHDL 2.3 05/10/2016   Lab Results  Component Value Date   TSH 3.060 05/14/2017   No results found for: "HGBA1C" Lab Results  Component Value Date   WBC 5.4 05/14/2017   HGB 13.3 05/14/2017   HCT 41.7 05/14/2017   MCV 88 05/14/2017   PLT 272 05/14/2017   Lab Results  Component Value Date   ALT 19 05/14/2017   AST 19 05/14/2017   ALKPHOS 68 05/14/2017   BILITOT 0.3 05/14/2017   No results  found for: "25OHVITD2", "25OHVITD3", "VD25OH"   Review of Systems  Constitutional:  Negative for chills, fatigue and fever.  HENT:  Negative for congestion, hearing loss, tinnitus, trouble swallowing and voice change.   Eyes:  Negative for visual disturbance.  Respiratory:  Negative for cough, chest tightness, shortness of breath and wheezing.   Cardiovascular:  Negative for chest pain, palpitations and leg swelling.  Gastrointestinal:  Negative for abdominal pain, constipation, diarrhea and vomiting.  Endocrine: Negative for polydipsia and polyuria.  Genitourinary:  Negative for dysuria, frequency, genital sores, vaginal bleeding and vaginal discharge.  Musculoskeletal:  Negative for arthralgias, gait problem and joint swelling.  Skin:  Negative for color change and rash.  Neurological:  Negative for dizziness, tremors, light-headedness and headaches.  Hematological:  Negative for adenopathy. Does not bruise/bleed easily.  Psychiatric/Behavioral:  Negative for dysphoric mood and sleep disturbance. The patient is not nervous/anxious.     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  Current Meds  Medication Sig   ALPRAZolam (XANAX) 0.25 MG tablet Take 1 tablet (0.25 mg total) by mouth 2 (two) times daily as needed for anxiety.   buPROPion (WELLBUTRIN XL) 300 MG 24 hr  tablet TAKE 1 TABLET BY MOUTH EVERY DAY       05/12/2022    8:24 AM 12/27/2021   11:34 AM 09/26/2021    2:09 PM 05/03/2021    3:48 PM  GAD 7 : Generalized Anxiety Score  Nervous, Anxious, on Edge 0 1 0 1  Control/stop worrying 0 1 0 1  Worry too much - different things 0 1 0 1  Trouble relaxing 0 1 0 1  Restless 0 0 0 0  Easily annoyed or irritable 0 1 0 2  Afraid - awful might happen 0 0 0 0  Total GAD 7 Score 0 5 0 6  Anxiety Difficulty Not difficult at all Not difficult at all Not difficult at all Somewhat difficult       05/12/2022    8:24 AM 12/27/2021   11:33 AM 09/26/2021    2:09 PM  Depression screen PHQ 2/9  Decreased Interest 0 0 0  Down, Depressed, Hopeless 0 0 0  PHQ - 2 Score 0 0 0  Altered sleeping 0 1 0  Tired, decreased energy 0 1 0  Change in appetite 0 0 0  Feeling bad or failure about yourself  0 0 0  Trouble concentrating 0 0 0  Moving slowly or fidgety/restless 0 0 0  Suicidal thoughts 0 0 0  PHQ-9 Score 0 2 0  Difficult doing work/chores Not difficult at all Not difficult at all Not difficult at all    BP Readings from Last 3 Encounters:  05/12/22 110/72  01/19/22 124/78  12/27/21 104/78    Physical Exam Vitals and nursing note reviewed.  Constitutional:      General: She is not in acute distress.    Appearance: She is well-developed.  HENT:     Head: Normocephalic and atraumatic.     Right Ear: Tympanic membrane and ear canal normal.     Left Ear: Tympanic membrane and ear canal normal.     Nose:     Right Sinus: No maxillary sinus tenderness.     Left Sinus: No maxillary sinus tenderness.  Eyes:     General: No scleral icterus.       Right eye: No discharge.        Left eye: No discharge.     Conjunctiva/sclera: Conjunctivae normal.  Neck:     Thyroid: No thyromegaly.     Vascular: No carotid bruit.  Cardiovascular:     Rate and Rhythm: Normal rate and regular rhythm.     Pulses: Normal pulses.     Heart sounds: Normal heart sounds.   Pulmonary:     Effort: Pulmonary effort is normal. No respiratory distress.     Breath sounds: No wheezing.  Chest:  Breasts:    Right: No mass, nipple discharge, skin change or tenderness.     Left: No mass, nipple discharge, skin change or tenderness.  Abdominal:     General: Bowel sounds are normal.     Palpations: Abdomen is soft.  Tenderness: There is no abdominal tenderness.  Musculoskeletal:     Cervical back: Normal range of motion. No erythema.     Right lower leg: No edema.     Left lower leg: No edema.  Lymphadenopathy:     Cervical: No cervical adenopathy.  Skin:    General: Skin is warm and dry.     Findings: No rash.  Neurological:     Mental Status: She is alert and oriented to person, place, and time.     Cranial Nerves: No cranial nerve deficit.     Sensory: No sensory deficit.     Deep Tendon Reflexes: Reflexes are normal and symmetric.  Psychiatric:        Attention and Perception: Attention normal.        Mood and Affect: Mood normal.     Wt Readings from Last 3 Encounters:  05/12/22 175 lb 3.2 oz (79.5 kg)  01/19/22 178 lb (80.7 kg)  12/27/21 172 lb (78 kg)    BP 110/72   Pulse 99   Ht 5\' 7"  (1.702 m)   Wt 175 lb 3.2 oz (79.5 kg)   SpO2 99%   BMI 27.44 kg/m   Assessment and Plan: Problem List Items Addressed This Visit       Other   Anxiety (Chronic)    Clinically stable on current regimen with good control of symptoms, No SI or HI. No change in management at this time. Continue Bupropion daily and PRN xanax      Relevant Orders   TSH   Other Visit Diagnoses     Annual physical exam    -  Primary   continue healthy diet and exercise   Relevant Orders   CBC with Differential/Platelet   Comprehensive metabolic panel   Lipid panel   Hepatitis C antibody   TSH   Encounter for screening mammogram for breast cancer       schedule for June at [facility]   Relevant Orders   MM 3D SCREEN BREAST BILATERAL   Colon cancer screening        Relevant Orders   Ambulatory referral to Gastroenterology   Need for hepatitis C screening test       Relevant Orders   Hepatitis C antibody        Partially dictated using Editor, commissioning. Any errors are unintentional.  Halina Maidens, MD [facility]  05/12/2022

## 2022-05-12 NOTE — Assessment & Plan Note (Signed)
Clinically stable on current regimen with good control of symptoms, No SI or HI. No change in management at this time. Continue Bupropion daily and PRN xanax

## 2022-05-12 NOTE — Patient Instructions (Signed)
Call [facility] to schedule your mammogram at [phone].  Dr. Phillip Heal [phone]

## 2022-05-13 LAB — CBC WITH DIFFERENTIAL/PLATELET
Basophils Absolute: 0 10*3/uL (ref 0.0–0.2)
Basos: 1 %
EOS (ABSOLUTE): 0.1 10*3/uL (ref 0.0–0.4)
Eos: 1 %
Hematocrit: 42.5 % (ref 34.0–46.6)
Hemoglobin: 14.6 g/dL (ref 11.1–15.9)
Immature Grans (Abs): 0 10*3/uL (ref 0.0–0.1)
Immature Granulocytes: 0 %
Lymphocytes Absolute: 1.9 10*3/uL (ref 0.7–3.1)
Lymphs: 32 %
MCH: 29 pg (ref 26.6–33.0)
MCHC: 34.4 g/dL (ref 31.5–35.7)
MCV: 85 fL (ref 79–97)
Monocytes Absolute: 0.6 10*3/uL (ref 0.1–0.9)
Monocytes: 10 %
Neutrophils Absolute: 3.2 10*3/uL (ref 1.4–7.0)
Neutrophils: 56 %
Platelets: 267 10*3/uL (ref 150–450)
RBC: 5.03 x10E6/uL (ref 3.77–5.28)
RDW: 13.2 % (ref 11.7–15.4)
WBC: 5.8 10*3/uL (ref 3.4–10.8)

## 2022-05-13 LAB — HEPATITIS C ANTIBODY: Hep C Virus Ab: NONREACTIVE

## 2022-05-13 LAB — LIPID PANEL
Chol/HDL Ratio: 2.3 ratio (ref 0.0–4.4)
Cholesterol, Total: 175 mg/dL (ref 100–199)
HDL: 77 mg/dL (ref >39)
LDL Chol Calc (NIH): 88 mg/dL (ref 0–99)
Triglycerides: 48 mg/dL (ref 0–149)
VLDL Cholesterol Cal: 10 mg/dL (ref 5–40)

## 2022-05-13 LAB — COMPREHENSIVE METABOLIC PANEL
ALT: 16 IU/L (ref 0–32)
AST: 13 IU/L (ref 0–40)
Albumin/Globulin Ratio: 2.4 — ABNORMAL HIGH (ref 1.2–2.2)
Albumin: 4.7 g/dL (ref 3.9–4.9)
Alkaline Phosphatase: 65 IU/L (ref 44–121)
BUN/Creatinine Ratio: 13 (ref 9–23)
BUN: 13 mg/dL (ref 6–24)
Bilirubin Total: 1 mg/dL (ref 0.0–1.2)
CO2: 22 mmol/L (ref 20–29)
Calcium: 10 mg/dL (ref 8.7–10.2)
Chloride: 103 mmol/L (ref 96–106)
Creatinine, Ser: 0.99 mg/dL (ref 0.57–1.00)
Globulin, Total: 2 g/dL (ref 1.5–4.5)
Glucose: 92 mg/dL (ref 70–99)
Potassium: 4.6 mmol/L (ref 3.5–5.2)
Sodium: 140 mmol/L (ref 134–144)
Total Protein: 6.7 g/dL (ref 6.0–8.5)
eGFR: 70 mL/min/{1.73_m2} (ref >59)

## 2022-05-13 LAB — TSH: TSH: 1.51 u[IU]/mL (ref 0.450–4.500)

## 2022-05-15 ENCOUNTER — Other Ambulatory Visit: Payer: Self-pay

## 2022-05-15 ENCOUNTER — Telehealth: Payer: Self-pay

## 2022-05-15 DIAGNOSIS — Z1211 Encounter for screening for malignant neoplasm of colon: Secondary | ICD-10-CM

## 2022-05-15 MED ORDER — NA SULFATE-K SULFATE-MG SULF 17.5-3.13-1.6 GM/177ML PO SOLN: 1.0000 | Freq: Once | ORAL | 0 refills | Status: AC

## 2022-05-15 NOTE — Telephone Encounter (Signed)
Gastroenterology Pre-Procedure Review  Request Date: tbd Requesting Physician: Dr. Dellie Catholic  PATIENT REVIEW QUESTIONS: The patient responded to the following health history questions as indicated:    1. Are you having any GI issues? no 2. Do you have a personal history of Polyps? no 3. Do you have a family history of Colon Cancer or Polyps? no 4. Diabetes Mellitus? no 5. Joint replacements in the past 12 months?no 6. Major health problems in the past 3 months?no 7. Any artificial heart valves, MVP, or defibrillator?no    MEDICATIONS & ALLERGIES:    Patient reports the following regarding taking any anticoagulation/antiplatelet therapy:   Plavix, Coumadin, Eliquis, Xarelto, Lovenox, Pradaxa, Brilinta, or Effient? no Aspirin? no  Patient confirms/reports the following medications:  Current Outpatient Medications  Medication Sig Dispense Refill   ALPRAZolam (XANAX) 0.25 MG tablet Take 1 tablet (0.25 mg total) by mouth 2 (two) times daily as needed for anxiety. 30 tablet 0   buPROPion (WELLBUTRIN XL) 300 MG 24 hr tablet TAKE 1 TABLET BY MOUTH EVERY DAY 90 tablet 1   No current facility-administered medications for this visit.    Patient confirms/reports the following allergies:  Allergies  Allergen Reactions   Penicillins     No orders of the defined types were placed in this encounter.   AUTHORIZATION INFORMATION Primary Insurance: 1D#: Group #:  Secondary Insurance: 1D#: Group #:  SCHEDULE INFORMATION: Date: tbd Time: Location: [facility]

## 2022-05-15 NOTE — Telephone Encounter (Signed)
Pt called back.  Requested to schedule her colonoscopy 09/22/22.    Thanks, [place], [place]

## 2022-05-15 NOTE — Addendum Note (Signed)
Addended by: Vanetta Mulders on: 05/15/2022 10:04 AM   Modules accepted: Orders

## 2022-06-16 ENCOUNTER — Encounter: Payer: Self-pay | Admitting: Internal Medicine

## 2022-06-16 ENCOUNTER — Ambulatory Visit: Payer: BC Managed Care – PPO | Admitting: Internal Medicine

## 2022-06-16 VITALS — BP 112/72 | HR 97 | Temp 99.0°F | Ht 67.0 in | Wt 174.0 lb

## 2022-06-16 DIAGNOSIS — J020 Streptococcal pharyngitis: Secondary | ICD-10-CM

## 2022-06-16 LAB — POCT INFLUENZA A/B
Influenza A, POC: NEGATIVE
Influenza B, POC: NEGATIVE

## 2022-06-16 LAB — POCT RAPID STREP A (OFFICE): Rapid Strep A Screen: POSITIVE — AB

## 2022-06-16 MED ORDER — AZITHROMYCIN 250 MG PO TABS: ORAL_TABLET | ORAL | 0 refills | Status: AC

## 2022-06-16 NOTE — Progress Notes (Signed)
Date:  06/16/2022   Name:  Vanessa Mcneil   DOB:  04/14/1975   MRN:  [id number]   Chief Complaint: Sore Throat  Sore Throat  This is a new problem. Episode onset: X2 days. The problem has been rapidly worsening. There has been no fever. The pain is at a severity of 2/10. The pain is mild. Associated symptoms include coughing and headaches. She has had exposure to strep. She has tried acetaminophen for the symptoms. The treatment provided mild relief.    Lab Results  Component Value Date   NA 140 05/12/2022   K 4.6 05/12/2022   CO2 22 05/12/2022   GLUCOSE 92 05/12/2022   BUN 13 05/12/2022   CREATININE 0.99 05/12/2022   CALCIUM 10.0 05/12/2022   EGFR 70 05/12/2022   GFRNONAA 92 05/14/2017   Lab Results  Component Value Date   CHOL 175 05/12/2022   HDL 77 05/12/2022   LDLCALC 88 05/12/2022   TRIG 48 05/12/2022   CHOLHDL 2.3 05/12/2022   Lab Results  Component Value Date   TSH 1.510 05/12/2022   No results found for: "HGBA1C" Lab Results  Component Value Date   WBC 5.8 05/12/2022   HGB 14.6 05/12/2022   HCT 42.5 05/12/2022   MCV 85 05/12/2022   PLT 267 05/12/2022   Lab Results  Component Value Date   ALT 16 05/12/2022   AST 13 05/12/2022   ALKPHOS 65 05/12/2022   BILITOT 1.0 05/12/2022   No results found for: "25OHVITD2", "25OHVITD3", "VD25OH"   Review of Systems  Constitutional:  Positive for fatigue. Negative for chills, diaphoresis and fever.  HENT:  Positive for sore throat.   Eyes:  Negative for visual disturbance.  Respiratory:  Positive for cough. Negative for chest tightness.   Cardiovascular:  Negative for chest pain and palpitations.  Neurological:  Positive for headaches.  Psychiatric/Behavioral:  Negative for dysphoric mood and sleep disturbance. The patient is not nervous/anxious.     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  Current Meds  Medication Sig   ALPRAZolam (XANAX) 0.25 MG tablet Take 1 tablet (0.25 mg total) by mouth 2 (two) times daily as needed for anxiety.   azithromycin (ZITHROMAX Z-PAK) 250 MG tablet UAD   buPROPion (WELLBUTRIN XL) 300 MG 24 hr tablet TAKE 1 TABLET BY MOUTH EVERY DAY       06/16/2022    3:32 PM 05/12/2022    8:24 AM 12/27/2021   11:34 AM 09/26/2021    2:09 PM  GAD 7 : Generalized Anxiety Score  Nervous, Anxious, on Edge 0 0 1 0  Control/stop worrying 0 0 1 0  Worry too much - different things 0 0 1 0  Trouble relaxing 0 0 1 0  Restless 0 0 0 0  Easily annoyed or irritable 0 0 1 0  Afraid - awful might happen 0 0 0 0  Total GAD 7 Score 0 0 5 0  Anxiety Difficulty Not difficult at all Not difficult at all Not difficult at all Not difficult at all       06/16/2022    3:31 PM 05/12/2022    8:24 AM 12/27/2021   11:33 AM  Depression screen PHQ 2/9  Decreased Interest 0 0 0  Down, Depressed, Hopeless 0 0 0  PHQ - 2 Score 0 0 0  Altered sleeping 0 0 1  Tired, decreased energy 1 0 1  Change in appetite 0 0 0  Feeling bad or failure about yourself  0 0 0  Trouble concentrating 0 0 0  Moving slowly or fidgety/restless 0 0 0  Suicidal thoughts 0 0 0  PHQ-9 Score 1 0 2  Difficult doing work/chores Not difficult at all Not difficult at all Not difficult at all    BP Readings from Last 3 Encounters:  06/16/22 112/72  05/12/22 110/72  01/19/22 124/78    Physical  Exam Constitutional:      Appearance: She is well-developed.  HENT:     Mouth/Throat:     Pharynx: No oropharyngeal exudate or posterior oropharyngeal erythema.     Tonsils: No tonsillar exudate.  Eyes:     Conjunctiva/sclera: Conjunctivae normal.  Cardiovascular:     Rate and Rhythm: Normal rate and regular rhythm.     Heart sounds: Normal heart sounds.  Pulmonary:     Breath sounds: Normal breath sounds. No wheezing or rhonchi.  Musculoskeletal:     Cervical back: Full passive range of motion without pain.  Lymphadenopathy:     Cervical: No cervical adenopathy.  Neurological:     Mental Status: She is alert.     Wt Readings from Last 3 Encounters:  06/16/22 174 lb (78.9 kg)  05/12/22 175 lb 3.2 oz (79.5 kg)  01/19/22 178 lb (80.7 kg)    BP 112/72   Pulse 97   Temp 99 F (37.2 C) (Oral)   Ht '5\' 7"'$  (1.702 m)   Wt 174 lb (78.9 kg)   SpO2 97%   BMI 27.25 kg/m   Assessment and Plan: Problem List Items Addressed This Visit   None Visit Diagnoses     Strep pharyngitis    -  Primary   Take tylenol or Advil every 6-8 hours warm or cool liquids   Relevant Medications   azithromycin (ZITHROMAX Z-PAK) 250 MG tablet   Other Relevant Orders   POCT Influenza A/B (Completed)   POCT rapid strep A (Completed)        Partially dictated using Editor, commissioning. Any errors are unintentional.  Halina Maidens, MD [facility]  06/16/2022

## 2022-06-19 ENCOUNTER — Ambulatory Visit: Payer: Self-pay

## 2022-06-19 NOTE — Telephone Encounter (Signed)
  Chief Complaint: flu exposure Symptoms: chest congestion, sore throat, HA, runny nose, fatigue and weakness Frequency: ongoing since last week Pertinent Negatives: Patient denies fever today Disposition: [] ED /[] Urgent Care (no appt availability in office) / [] Appointment(In office/virtual)/ []  [facility]/ [] Home Care/ [] Refused Recommended Disposition /[]  Mobile Bus/ [x]  Follow-up with PCP Additional Notes: pt concerned that she may have flu since she has been on abx and sx getting worse, everyone in her house has tested positive for FLU so pt asking if she needs to come back in for testing or if Tamiflu could be prescribed. Pt had flu test on 06/16/22 that was negative for A/B. Called and spoke with Levada Dy, [facility] who states that best to send message back and will route to provider for review and recommendations. Advised pt of this and that Dr. Army Melia nurse will FU with her. Pt verbalized understanding.   Summary: Flu Advice   Pt is calling to report that she was seen in office on 06/16/22 for Strep pharyngitis. Pt is on antibiotics. And 4/6 family member tested positive for the flu. Pt is calling to be tested for the flu. Sx included chest congestion, headache, runny nose. Please advise       Reason for Disposition  [1] Influenza EXPOSURE within last 48 hours (2 days) AND [2] NOT HIGH RISK AND [3] strongly requests antiviral medication  Answer Assessment - Initial Assessment Questions 1. TYPE of EXPOSURE: "How were you exposed?" (e.g., close contact, not a close contact)     Close contact 2. DATE of EXPOSURE: "When did the exposure occur?" (e.g., hour, days, weeks)     Last Wednesday  4. HIGH RISK for COMPLICATIONS: "Do you have any heart or lung problems?" "Do you have a weakened immune system?" (e.g., CHF, COPD, asthma, HIV positive, chemotherapy, renal failure, diabetes mellitus, sickle cell anemia)     no 5. SYMPTOMS: "Do you have any symptoms?" (e.g., cough,  fever, sore throat, difficulty breathing).     Chest congestion, HA, runny nose, fatigue and weakness  Protocols used: Influenza (Flu) [place]

## 2022-06-19 NOTE — Telephone Encounter (Signed)
Called and spoke with patient. Informed that is too late to take tamiflu since she is out of the 48 hour window. Told pt to stay hydrated, and get plenty of rest.  Abhimanyu Cruces

## 2022-07-29 ENCOUNTER — Encounter: Payer: Self-pay | Admitting: Internal Medicine

## 2022-08-04 ENCOUNTER — Encounter: Payer: Self-pay | Admitting: Internal Medicine

## 2022-08-04 ENCOUNTER — Ambulatory Visit: Payer: BC Managed Care – PPO | Admitting: Internal Medicine

## 2022-08-04 VITALS — BP 110/78 | HR 85 | Ht 67.0 in | Wt 178.4 lb

## 2022-08-04 DIAGNOSIS — F419 Anxiety disorder, unspecified: Secondary | ICD-10-CM

## 2022-08-04 MED ORDER — BUPROPION HCL ER (XL) 150 MG PO TB24: 450.0000 mg | ORAL_TABLET | Freq: Every day | ORAL | 1 refills | Status: DC

## 2022-08-04 NOTE — Patient Instructions (Signed)
-  It was a pleasure to see you today! Please review your visit summary for helpful information. -Lab results are usually available within 1-2 days and we will call once reviewed. -I would encourage you to follow your care via MyChart where you can access lab results, notes, messages, and more. -If you feel that we did a nice job today, please complete your after-visit survey and leave us a Google review! Your CMA today was Tamsen Reist and your provider was Dr Laura Berglund, MD.  

## 2022-08-04 NOTE — Progress Notes (Signed)
Date:  08/04/2022   Name:  Vanessa Mcneil   DOB:  1975/06/07   MRN:  [id number]   Chief Complaint: Anxiety  Anxiety Presents for follow-up (on Bupropion) visit. Symptoms include decreased concentration and nervous/anxious behavior. Patient reports no chest pain, dizziness, palpitations or shortness of breath. The quality of sleep is good. Nighttime awakenings: none.   Compliance with medications is 76-100%.  She is having peri-menopausal symptoms with mood swings and anxiety.  She has tried Prozac in the past with side effects.  Has been on Bupropion for several years and done well.  Only recently feels like her anxiety is not controlled.  She has xanax to take PRN but does not want to rely on it.  Lab Results  Component Value Date   NA 140 05/12/2022   K 4.6 05/12/2022   CO2 22 05/12/2022   GLUCOSE 92 05/12/2022   BUN 13 05/12/2022   CREATININE 0.99 05/12/2022   CALCIUM 10.0 05/12/2022   EGFR 70 05/12/2022   GFRNONAA 92 05/14/2017   Lab Results  Component Value Date   CHOL 175 05/12/2022   HDL 77 05/12/2022   LDLCALC 88 05/12/2022   TRIG 48 05/12/2022   CHOLHDL 2.3 05/12/2022   Lab Results  Component Value Date   TSH 1.510 05/12/2022   No results found for: "HGBA1C" Lab Results  Component Value Date   WBC 5.8 05/12/2022   HGB 14.6 05/12/2022   HCT 42.5 05/12/2022   MCV 85 05/12/2022   PLT 267 05/12/2022   Lab Results  Component Value Date   ALT 16 05/12/2022   AST 13 05/12/2022   ALKPHOS 65 05/12/2022   BILITOT 1.0 05/12/2022   No results found for: "25OHVITD2", "25OHVITD3", "VD25OH"   Review of Systems  Constitutional:  Negative for fatigue and unexpected weight change.  HENT:  Negative for nosebleeds.   Eyes:  Negative for visual disturbance.  Respiratory:  Negative for cough, chest tightness, shortness of breath and wheezing.   Cardiovascular:  Negative for chest pain, palpitations and leg swelling.  Gastrointestinal:  Negative for abdominal pain,  constipation and diarrhea.  Genitourinary:  Positive for menstrual problem.  Neurological:  Negative for dizziness, weakness, light-headedness and headaches.  Psychiatric/Behavioral:  Positive for decreased concentration. Negative for dysphoric mood and sleep disturbance. The patient is nervous/anxious.     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  Current Meds  Medication Sig   ALPRAZolam (XANAX) 0.25 MG tablet Take 1 tablet (0.25 mg total) by mouth 2 (two) times daily as needed for anxiety.   buPROPion (WELLBUTRIN XL) 150 MG 24 hr tablet Take 3 tablets (450 mg total) by mouth daily.   [DISCONTINUED] buPROPion (WELLBUTRIN XL) 300 MG 24 hr tablet TAKE 1 TABLET BY  MOUTH EVERY DAY       08/04/2022    9:53 AM 06/16/2022    3:32 PM 05/12/2022    8:24 AM 12/27/2021   11:34 AM  GAD 7 : Generalized Anxiety Score  Nervous, Anxious, on Edge 2 0 0 1  Control/stop worrying 2 0 0 1  Worry too much - different things 1 0 0 1  Trouble relaxing 1 0 0 1  Restless 0 0 0 0  Easily annoyed or irritable 1 0 0 1  Afraid - awful might happen 0 0 0 0  Total GAD 7 Score 7 0 0 5  Anxiety Difficulty Somewhat difficult Not difficult at all Not difficult at all Not difficult at all       08/04/2022    9:53 AM 06/16/2022    3:31 PM 05/12/2022    8:24 AM  Depression screen PHQ 2/9   Decreased Interest 2 0 0  Down, Depressed, Hopeless 0 0 0  PHQ - 2 Score 2 0 0  Altered sleeping 0 0 0  Tired, decreased energy 2 1 0  Change in appetite 0 0 0  Feeling bad or failure about yourself  1 0 0  Trouble concentrating 1 0 0  Moving slowly or fidgety/restless 0 0 0  Suicidal thoughts 0 0 0  PHQ-9 Score 6 1 0  Difficult doing work/chores Somewhat difficult Not difficult at all Not difficult at all    BP Readings from Last 3 Encounters:  08/04/22 110/78  06/16/22 112/72  05/12/22 110/72    Physical Exam Vitals and nursing note reviewed.  Constitutional:      General: She is not in acute distress.    Appearance: She is well-developed.  HENT:     Head: Normocephalic and atraumatic.  Pulmonary:     Effort: Pulmonary effort is normal. No respiratory distress.  Skin:    General: Skin is warm and dry.     Findings: No rash.  Neurological:     Mental Status: She is alert and oriented to person, place, and time.  Psychiatric:        Attention and Perception: Attention normal.        Mood and Affect: Mood normal.        Behavior: Behavior normal.        Thought Content: Thought content normal.     Wt Readings from Last 3 Encounters:  08/04/22 178 lb 6.4 oz (80.9 kg)  06/16/22 174 lb (78.9 kg)  05/12/22 175 lb 3.2 oz (79.5 kg)    BP 110/78   Pulse 85   Ht 5\' 7"  (1.702 m)   Wt 178 lb 6.4 oz (80.9 kg)   LMP 07/06/2022 (Exact Date)   SpO2 99%   BMI 27.94 kg/m   Assessment and Plan:  Problem List Items Addressed This Visit       Other   Anxiety - Primary (Chronic)    Has done well for years on bupropion. However, having mood swings and hormone fluctuations. Did not tolerate Prozac in the past. Will increase Bupropion to 450 mg daily. If no improvement, consider a low dose of a newer SSRI such as Lexapro.      Relevant Medications   buPROPion (WELLBUTRIN XL) 150 MG 24 hr tablet    No follow-ups on file.   Partially dictated using Dragon  software, any errors are not intentional.  Reubin Milan, MD [facility] [place], [place]

## 2022-08-04 NOTE — Assessment & Plan Note (Signed)
Has done well for years on bupropion. However, having mood swings and hormone fluctuations. Did not tolerate Prozac in the past. Will increase Bupropion to 450 mg daily. If no improvement, consider a low dose of a newer SSRI such as Lexapro.

## 2022-09-11 ENCOUNTER — Encounter: Payer: Self-pay | Admitting: Anesthesiology

## 2022-09-17 ENCOUNTER — Other Ambulatory Visit: Payer: Self-pay | Admitting: Internal Medicine

## 2022-09-17 DIAGNOSIS — F419 Anxiety disorder, unspecified: Secondary | ICD-10-CM

## 2022-09-18 NOTE — Telephone Encounter (Signed)
Rx was sent to pharmacy on 08/04/22 #270/1.   Requested Prescriptions  Refused Prescriptions Disp Refills   buPROPion (WELLBUTRIN XL) 300 MG 24 hr tablet [Pharmacy Med Name: BUPROPION HCL XL 300 MG TABLET] 90 tablet 1    Sig: TAKE 1 TABLET BY MOUTH EVERY DAY     Psychiatry: Antidepressants - bupropion Passed - 09/17/2022  8:43 AM      Passed - Cr in normal range and within 360 days    Creatinine, Ser  Date Value Ref Range Status  05/12/2022 0.99 0.57 - 1.00 mg/dL Final         Passed - AST in normal range and within 360 days    AST  Date Value Ref Range Status  05/12/2022 13 0 - 40 IU/L Final         Passed - ALT in normal range and within 360 days    ALT  Date Value Ref Range Status  05/12/2022 16 0 - 32 IU/L Final         Passed - Last BP in normal range    BP Readings from Last 1 Encounters:  08/04/22 110/78         Passed - Valid encounter within last 6 months    Recent Outpatient Visits           1 month ago Anxiety   [place] [facility] at [facility], Nyoka Cowden, MD   3 months ago Strep pharyngitis   [place] [facility] at [facility], Nyoka Cowden, MD   4 months ago Annual physical exam   [facility] at [facility], Nyoka Cowden, MD   8 months ago Acute non-recurrent maxillary sinusitis   [place] [facility] at [facility], Nyoka Cowden, MD   8 months ago Upper respiratory tract infection, unspecified type   [facility] at [facility], Nyoka Cowden, MD       Future Appointments             In 8 months Judithann Graves, Nyoka Cowden, MD [facility] Medicine at [facility], [facility]

## 2022-09-22 ENCOUNTER — Encounter: Admission: RE | Payer: Self-pay | Source: Home / Self Care

## 2022-09-22 ENCOUNTER — Ambulatory Visit
Admission: RE | Admit: 2022-09-22 | Payer: BC Managed Care – PPO | Source: Home / Self Care | Admitting: Gastroenterology

## 2022-09-22 SURGERY — COLONOSCOPY WITH PROPOFOL
Anesthesia: Choice

## 2022-11-02 ENCOUNTER — Other Ambulatory Visit: Payer: Self-pay | Admitting: Internal Medicine

## 2022-11-02 DIAGNOSIS — F419 Anxiety disorder, unspecified: Secondary | ICD-10-CM

## 2022-11-02 NOTE — Telephone Encounter (Signed)
Requested Prescriptions  Refused Prescriptions Disp Refills   buPROPion (WELLBUTRIN XL) 300 MG 24 hr tablet [Pharmacy Med Name: BUPROPION HCL XL 300 MG TABLET] 90 tablet 1    Sig: TAKE 1 TABLET BY MOUTH EVERY DAY     Psychiatry: Antidepressants - bupropion Passed - 11/02/2022  2:31 PM      Passed - Cr in normal range and within 360 days    Creatinine, Ser  Date Value Ref Range Status  05/12/2022 0.99 0.57 - 1.00 mg/dL Final         Passed - AST in normal range and within 360 days    AST  Date Value Ref Range Status  05/12/2022 13 0 - 40 IU/L Final         Passed - ALT in normal range and within 360 days    ALT  Date Value Ref Range Status  05/12/2022 16 0 - 32 IU/L Final         Passed - Last BP in normal range    BP Readings from Last 1 Encounters:  08/04/22 110/78         Passed - Valid encounter within last 6 months    Recent Outpatient Visits           3 months ago Anxiety   [place] [facility] at [facility], Nyoka Cowden, MD   4 months ago Strep pharyngitis   [place] [facility] at [facility], Nyoka Cowden, MD   5 months ago Annual physical exam   [facility] at [facility], Nyoka Cowden, MD   9 months ago Acute non-recurrent maxillary sinusitis   [place] [facility] at [facility] Nyoka Cowden, MD   10 months ago Upper respiratory tract infection, unspecified type   [facility] at [facility], Nyoka Cowden, MD       Future Appointments             In 6 months Judithann Graves, Nyoka Cowden, MD [facility] Primary Care & Sports Medicine at [facility], [facility]

## 2023-01-02 ENCOUNTER — Other Ambulatory Visit: Payer: Self-pay | Admitting: Internal Medicine

## 2023-01-02 DIAGNOSIS — F419 Anxiety disorder, unspecified: Secondary | ICD-10-CM

## 2023-01-02 NOTE — Telephone Encounter (Addendum)
Medication Refill - Medication:  ALPRAZolam (XANAX) 0.25 MG tablet  *Has 2 1/2 tablets left & going out of town this upcoming Friday (01/05/23) and did not want to be without this for that    Has the patient contacted their pharmacy? Yes, advised to contact PCP office  Preferred Pharmacy (with phone number or street name):  [id number] [place], [place] - [address]  Phone: [phone] Fax: [phone]    Has the patient been seen for an appointment in the last year OR does the patient have an upcoming appointment? Yes.   Patient states if someone has to call her, to call her cell phone @ # [phone]

## 2023-01-03 MED ORDER — ALPRAZOLAM 0.25 MG PO TABS
0.2500 mg | ORAL_TABLET | Freq: Two times a day (BID) | ORAL | 1 refills | Status: DC | PRN
Start: 2023-01-03 — End: 2024-01-21

## 2023-01-03 NOTE — Telephone Encounter (Signed)
Please review.  KP

## 2023-01-03 NOTE — Telephone Encounter (Signed)
Requested medication (s) are due for refill today: yes  Requested medication (s) are on the active medication list: yes  Last refill:  09/26/21  Future visit scheduled: yes  Notes to clinic:  Unable to refill per protocol, cannot delegate.      Requested Prescriptions  Pending Prescriptions Disp Refills   ALPRAZolam (XANAX) 0.25 MG tablet 30 tablet 0    Sig: Take 1 tablet (0.25 mg total) by mouth 2 (two) times daily as needed for anxiety.     Not Delegated - Psychiatry: Anxiolytics/Hypnotics 2 Failed - 01/02/2023  4:13 PM      Failed - This refill cannot be delegated      Failed - Urine Drug Screen completed in last 360 days      Passed - Patient is not pregnant      Passed - Valid encounter within last 6 months    Recent Outpatient Visits           5 months ago Anxiety   [place] [facility] at [facility], Nyoka Cowden, MD   6 months ago Strep pharyngitis   [place] [facility] at [facility], Nyoka Cowden, MD   7 months ago Annual physical exam   [facility] at [facility], Nyoka Cowden, MD   11 months ago Acute non-recurrent maxillary sinusitis   [place] [facility] at [facility], Nyoka Cowden, MD   1 year ago Upper respiratory tract infection, unspecified type   [facility] at [facility], Nyoka Cowden, MD       Future Appointments             In 4 months Judithann Graves, Nyoka Cowden, MD [facility] Medicine at [facility], [facility]

## 2023-03-19 ENCOUNTER — Other Ambulatory Visit: Payer: Self-pay | Admitting: Internal Medicine

## 2023-03-19 DIAGNOSIS — F419 Anxiety disorder, unspecified: Secondary | ICD-10-CM

## 2023-03-20 IMAGING — MG MM DIGITAL SCREENING BILAT W/ TOMO AND CAD
8 series · 9 of 24 positions shown · non-contrast
Comparison: Previous exam(s).

CLINICAL DATA: Screening.

EXAM:
DIGITAL SCREENING BILATERAL MAMMOGRAM WITH TOMOSYNTHESIS AND CAD
TECHNIQUE: Bilateral screening digital craniocaudal and mediolateral oblique
mammograms were obtained. Bilateral screening digital breast
tomosynthesis was performed. The images were evaluated with
computer-aided detection.

[L MLO synth-2D]
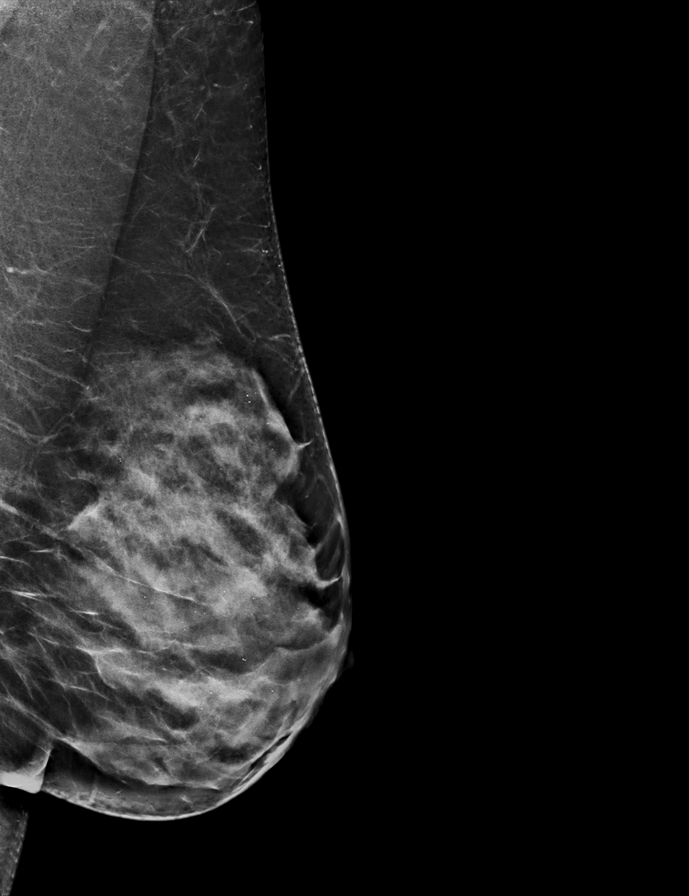

[L CC synth-2D]
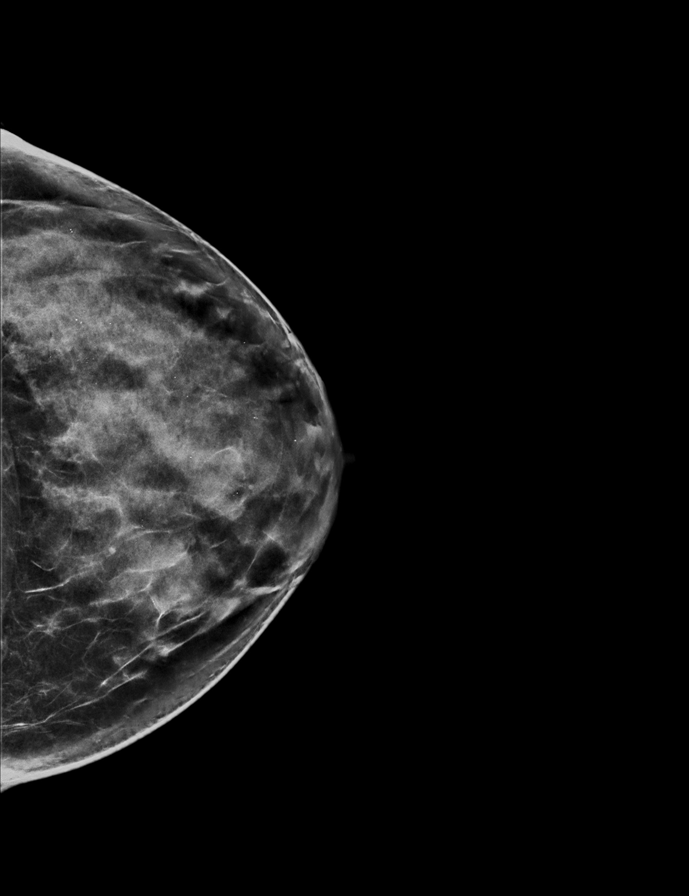

[R CC synth-2D]
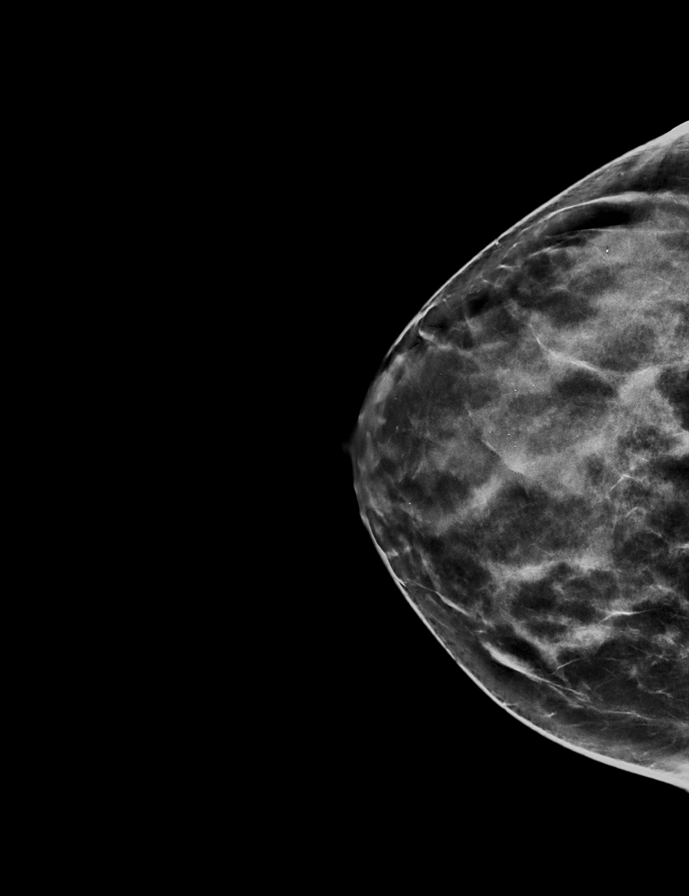

[R MLO synth-2D]
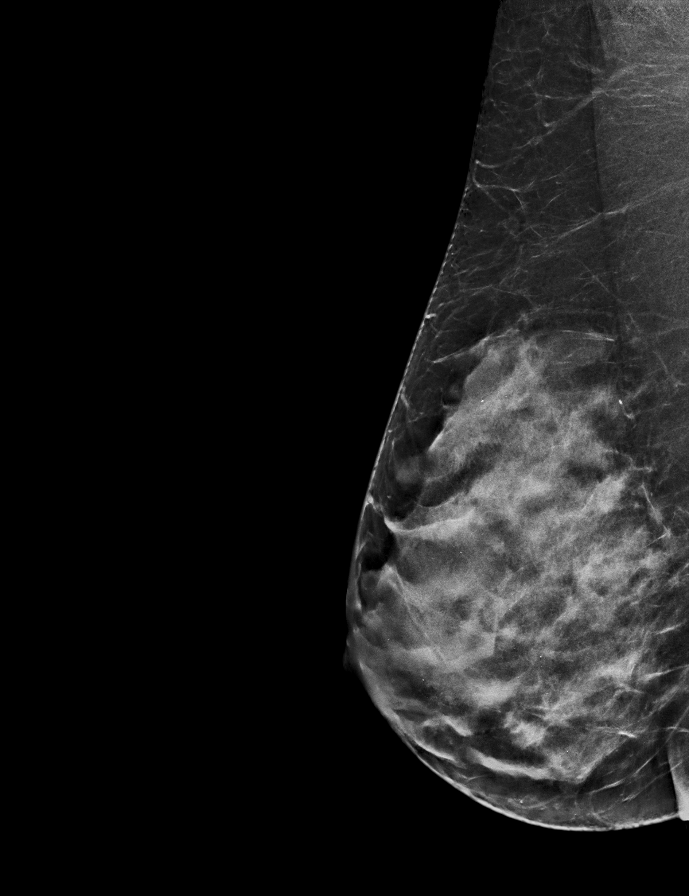

[L MLO tomo · 2 of 66 frames shown]
[frame 22/66]
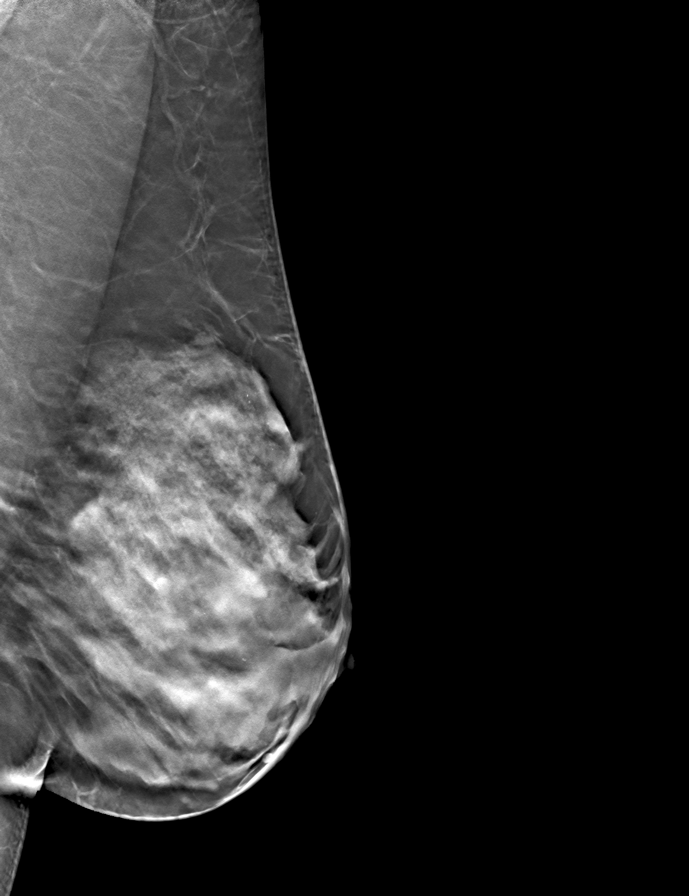
[frame 33/66]
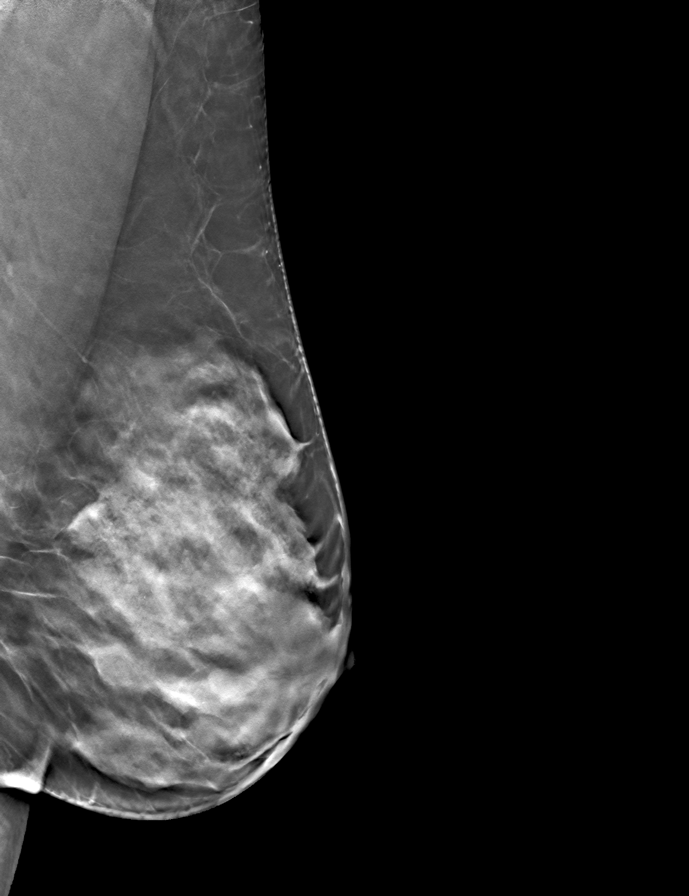

[R MLO tomo · tomo slice 33/66.0]
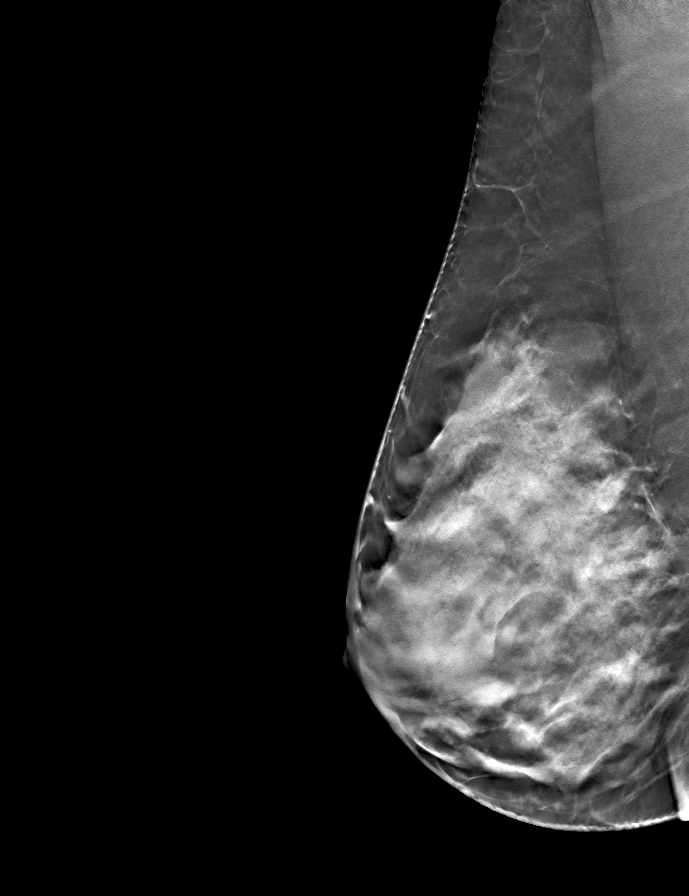

[L CC tomo · tomo slice 35/69.0]
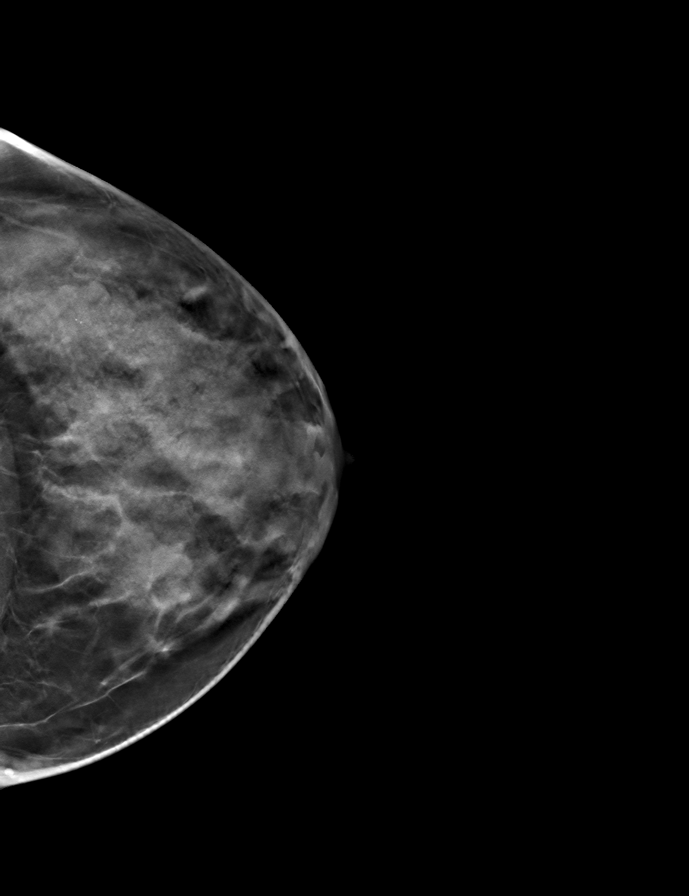

[R CC tomo · tomo slice 34/67.0]
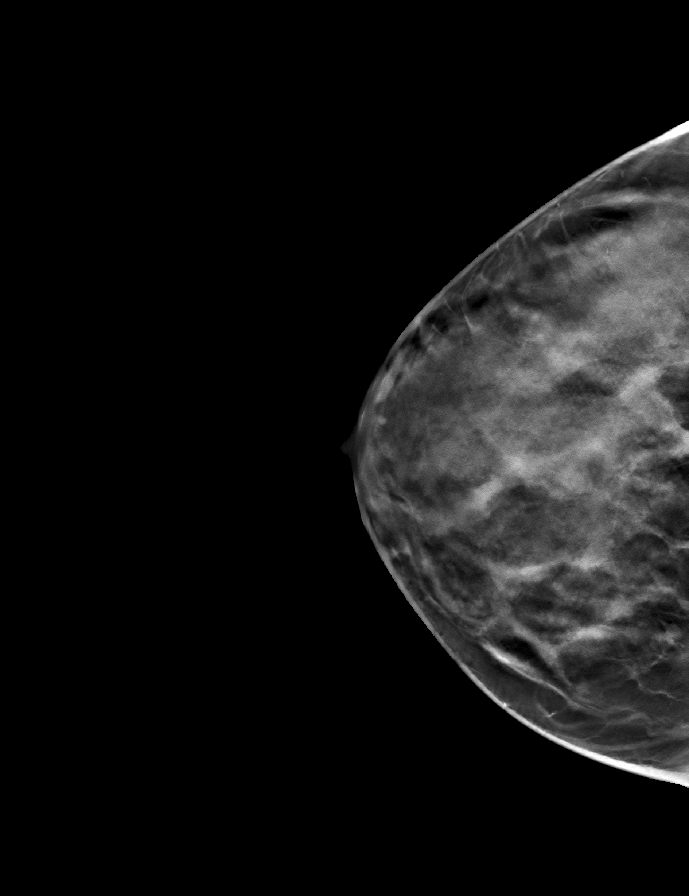

[9 of 24 positions shown; findings below may reference images not displayed]

ACR Breast Density Category d: The breast tissue is extremely dense,
which lowers the sensitivity of mammography
FINDINGS: There are no findings suspicious for malignancy.
IMPRESSION: No mammographic evidence of malignancy. A result letter of this
screening mammogram will be mailed directly to the patient.

RECOMMENDATION:
Screening mammogram in one year. (Code:[id number])

BI-RADS CATEGORY  1: Negative.

## 2023-03-21 NOTE — Telephone Encounter (Signed)
Requested Prescriptions  Pending Prescriptions Disp Refills   buPROPion (WELLBUTRIN XL) 150 MG 24 hr tablet [Pharmacy Med Name: BUPROPION HCL XL 150 MG TABLET] 270 tablet 1    Sig: TAKE 3 TABLETS BY MOUTH DAILY.     Psychiatry: Antidepressants - bupropion Failed - 03/19/2023  5:23 PM      Failed - Valid encounter within last 6 months    Recent Outpatient Visits           7 months ago Anxiety   [place] [facility] at [facility], Nyoka Cowden, MD   9 months ago Strep pharyngitis   [place] [facility] at [facility], Nyoka Cowden, MD   10 months ago Annual physical exam   [facility] at [facility], Nyoka Cowden, MD   1 year ago Acute non-recurrent maxillary sinusitis   [place] [facility] at [facility], Nyoka Cowden, MD   1 year ago Upper respiratory tract infection, unspecified type   [facility] at [facility], Nyoka Cowden, MD       Future Appointments             In 2 months Reubin Milan, MD [facility] Primary Care & Sports Medicine at [facility], [facility]            Passed - Cr in normal range and within 360 days    Creatinine, Ser  Date Value Ref Range Status  05/12/2022 0.99 0.57 - 1.00 mg/dL Final         Passed - AST in normal range and within 360 days    AST  Date Value Ref Range Status  05/12/2022 13 0 - 40 IU/L Final         Passed - ALT in normal range and within 360 days    ALT  Date Value Ref Range Status  05/12/2022 16 0 - 32 IU/L Final         Passed - Last BP in normal range    BP Readings from Last 1 Encounters:  08/04/22 110/78

## 2023-04-19 ENCOUNTER — Ambulatory Visit
Admission: RE | Admit: 2023-04-19 | Discharge: 2023-04-19 | Disposition: A | Discharge status: Home / Self Care | Payer: 59 | Source: Ambulatory Visit | Attending: Internal Medicine | Admitting: Internal Medicine

## 2023-04-19 DIAGNOSIS — Z1231 Encounter for screening mammogram for malignant neoplasm of breast: Secondary | ICD-10-CM | POA: Insufficient documentation

## 2023-05-23 NOTE — Progress Notes (Unsigned)
Date:  05/24/2023   Name:  Vanessa Mcneil   DOB:  05/28/1975   MRN:  [id number]   Chief Complaint: No chief complaint on file. Vanessa Mcneil is a 48 y.o. female who presents today for her Complete Annual Exam. She feels well. She reports exercising walking daily. She reports she is sleeping well. Breast complaints none.  Menses are becoming irregular and she has some hot flashes.  Health Maintenance  Topic Date Due   Colon Cancer Screening  Never done   COVID-19 Vaccine (3 - 2024-25 season) 12/10/2022   HIV Screening  05/15/2027*   Pap with HPV screening  01/06/2025   DTaP/Tdap/Td vaccine (2 - Td or Tdap) 01/04/2028   Flu Shot  Completed   Hepatitis C Screening  Completed   HPV Vaccine  Aged Out  *Topic was postponed. The date shown is not the original due date.    HPI  Review of Systems  Constitutional:  Negative for chills, fatigue and fever.  HENT:  Negative for congestion, hearing loss, tinnitus, trouble swallowing and voice change.   Eyes:  Negative for visual disturbance.  Respiratory:  Negative for cough, chest tightness, shortness of breath and wheezing.   Cardiovascular:  Negative for chest pain, palpitations and leg swelling.  Gastrointestinal:  Negative for abdominal pain, constipation, diarrhea and vomiting.  Endocrine: Negative for polydipsia and polyuria.  Genitourinary:  Negative for dysuria, frequency, genital sores, vaginal bleeding and vaginal discharge.  Musculoskeletal:  Negative for arthralgias, gait problem and joint swelling.  Skin:  Negative for color change and rash.  Neurological:  Negative for dizziness, tremors, light-headedness and headaches.  Hematological:  Negative for adenopathy. Does not bruise/bleed easily.  Psychiatric/Behavioral:  Negative for dysphoric mood and sleep disturbance. The patient is not nervous/anxious.      Lab Results  Component Value Date   NA 140 05/12/2022   K 4.6 05/12/2022   CO2 22 05/12/2022   GLUCOSE 92  05/12/2022   BUN 13 05/12/2022   CREATININE 0.99 05/12/2022   CALCIUM 10.0 05/12/2022   EGFR 70 05/12/2022   GFRNONAA 92 05/14/2017   Lab Results  Component Value Date   CHOL 175 05/12/2022   HDL 77 05/12/2022   LDLCALC 88 05/12/2022   TRIG 48 05/12/2022   CHOLHDL 2.3 05/12/2022   Lab Results  Component Value Date   TSH 1.510 05/12/2022   No results found for: "HGBA1C" Lab Results  Component Value Date   WBC 5.8 05/12/2022   HGB 14.6 05/12/2022   HCT 42.5 05/12/2022   MCV 85 05/12/2022   PLT 267 05/12/2022   Lab Results  Component Value Date   ALT 16 05/12/2022   AST 13 05/12/2022   ALKPHOS 65 05/12/2022   BILITOT 1.0 05/12/2022   No results found for: "25OHVITD2", "25OHVITD3", "VD25OH"   Patient Active Problem List   Diagnosis Date Noted   Hot flushes, perimenopausal 05/03/2021   Anxiety 02/07/2015   Family history of malignant melanoma 02/07/2015   Oral herpes simplex, 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   Drug use: No     Medication list has been reviewed and updated.  Current Meds  Medication Sig   ALPRAZolam (XANAX) 0.25 MG tablet Take 1 tablet (0.25 mg total) by mouth 2 (two) times daily as needed for anxiety.   buPROPion (WELLBUTRIN XL) 150 MG 24 hr tablet TAKE 3 TABLETS BY MOUTH DAILY.   [DISCONTINUED] clindamycin (CLEOCIN) 300 MG capsule Take 300 mg by mouth every 6 (six) hours.       05/24/2023    8:05 AM 08/04/2022    9:53 AM 06/16/2022    3:32 PM 05/12/2022    8:24 AM  GAD 7 : Generalized Anxiety  Score  Nervous, Anxious, on Edge 0 2 0 0  Control/stop worrying 0 2 0 0  Worry too much - different things 0 1 0 0  Trouble relaxing 0 1 0 0  Restless 0 0 0 0  Easily annoyed or irritable 0 1 0 0  Afraid - awful might happen 0 0 0 0  Total GAD 7 Score 0 7 0 0  Anxiety Difficulty Not difficult at all Somewhat difficult Not difficult at all Not difficult at all       05/24/2023    8:05 AM 08/04/2022    9:53 AM 06/16/2022    3:31 PM  Depression screen PHQ 2/9  Decreased Interest 0 2 0  Down, Depressed, Hopeless 0 0 0  PHQ - 2 Score 0 2 0  Altered sleeping  0 0  Tired, decreased energy  2 1  Change in appetite  0 0  Feeling bad or failure about yourself   1 0  Trouble concentrating  1 0  Moving slowly or fidgety/restless  0 0  Suicidal thoughts  0 0  PHQ-9 Score  6 1  Difficult doing work/chores  Somewhat difficult Not difficult at all    BP Readings from Last 3 Encounters:  05/24/23 120/80  08/04/22 110/78  06/16/22 112/72    Physical Exam Vitals and nursing note reviewed.  Constitutional:      General: She is not in acute distress.    Appearance: She is well-developed.  HENT:     Head: Normocephalic and atraumatic.     Right Ear: Tympanic membrane and ear canal normal.     Left Ear: Tympanic membrane and ear canal normal.     Nose:     Right Sinus: No maxillary sinus tenderness.     Left Sinus: No maxillary sinus tenderness.  Eyes:     General: No scleral icterus.       Right eye: No discharge.        Left eye: No discharge.     Conjunctiva/sclera: Conjunctivae normal.  Neck:     Thyroid: No thyromegaly.     Vascular: No carotid bruit.  Cardiovascular:     Rate and Rhythm: Normal rate and regular rhythm.     Pulses: Normal pulses.     Heart sounds: Normal heart sounds.  Pulmonary:     Effort: Pulmonary effort is normal. No respiratory distress.     Breath sounds: No wheezing.  Chest:  Breasts:    Right: No mass, nipple discharge, skin change or tenderness.      Left: No mass, nipple discharge, skin change or tenderness.  Abdominal:     General: Bowel sounds are normal.     Palpations: Abdomen is  soft.     Tenderness: There is no abdominal tenderness.  Musculoskeletal:     Cervical back: Normal range of motion. No erythema.     Right lower leg: No edema.     Left lower leg: No edema.  Lymphadenopathy:     Cervical: No cervical adenopathy.  Skin:    General: Skin is warm and dry.     Findings: No rash.  Neurological:     Mental Status: She is alert and oriented to person, place, and time.     Cranial Nerves: No cranial nerve deficit.     Sensory: No sensory deficit.     Deep Tendon Reflexes: Reflexes are normal and symmetric.  Psychiatric:        Attention and Perception: Attention normal.        Mood and Affect: Mood normal.     Wt Readings from Last 3 Encounters:  05/24/23 187 lb (84.8 kg)  08/04/22 178 lb 6.4 oz (80.9 kg)  06/16/22 174 lb (78.9 kg)    BP 120/80   Pulse 81   Ht 5\' 7"  (1.702 m)   Wt 187 lb (84.8 kg)   LMP 04/18/2023   SpO2 97%   BMI 29.29 kg/m   Assessment and Plan:  Problem List Items Addressed This Visit   None Visit Diagnoses       Annual physical exam    -  Primary   continue healthy diet and exercise up to date on screenings and immunizations will refer for colonoscopy   Relevant Orders   CBC with Differential/Platelet   Comprehensive metabolic panel   Lipid panel   TSH   Hemoglobin A1c     Encounter for screening mammogram for breast cancer       Bi-RADS 1 in Jan 2025     Colon cancer screening       Relevant Orders   Ambulatory referral to Gastroenterology     Screening for lipid disorders       Relevant Orders   Lipid panel     Screening for diabetes mellitus       Relevant Orders   Hemoglobin A1c       No follow-ups on file.    Reubin Milan, MD [facility] Primary [facility] Medicine [place]

## 2023-05-24 ENCOUNTER — Encounter: Payer: Self-pay | Admitting: Internal Medicine

## 2023-05-24 ENCOUNTER — Ambulatory Visit (INDEPENDENT_AMBULATORY_CARE_PROVIDER_SITE_OTHER): Payer: 59 | Admitting: Internal Medicine

## 2023-05-24 VITALS — BP 120/80 | HR 81 | Ht 67.0 in | Wt 187.0 lb

## 2023-05-24 DIAGNOSIS — Z1231 Encounter for screening mammogram for malignant neoplasm of breast: Secondary | ICD-10-CM | POA: Diagnosis not present

## 2023-05-24 DIAGNOSIS — Z0000 Encounter for general adult medical examination without abnormal findings: Secondary | ICD-10-CM | POA: Diagnosis not present

## 2023-05-24 DIAGNOSIS — Z13220 Encounter for screening for lipoid disorders: Secondary | ICD-10-CM

## 2023-05-24 DIAGNOSIS — Z1211 Encounter for screening for malignant neoplasm of colon: Secondary | ICD-10-CM

## 2023-05-24 DIAGNOSIS — Z131 Encounter for screening for diabetes mellitus: Secondary | ICD-10-CM

## 2023-05-25 ENCOUNTER — Encounter: Payer: Self-pay | Admitting: Internal Medicine

## 2023-05-25 LAB — COMPREHENSIVE METABOLIC PANEL
ALT: 16 [IU]/L (ref 0–32)
AST: 14 [IU]/L (ref 0–40)
Albumin: 4.5 g/dL (ref 3.9–4.9)
Alkaline Phosphatase: 69 [IU]/L (ref 44–121)
BUN/Creatinine Ratio: 17 (ref 9–23)
BUN: 16 mg/dL (ref 6–24)
Bilirubin Total: 0.8 mg/dL (ref 0.0–1.2)
CO2: 23 mmol/L (ref 20–29)
Calcium: 9.8 mg/dL (ref 8.7–10.2)
Chloride: 102 mmol/L (ref 96–106)
Creatinine, Ser: 0.96 mg/dL (ref 0.57–1.00)
Globulin, Total: 2.1 g/dL (ref 1.5–4.5)
Glucose: 89 mg/dL (ref 70–99)
Potassium: 4 mmol/L (ref 3.5–5.2)
Sodium: 140 mmol/L (ref 134–144)
Total Protein: 6.6 g/dL (ref 6.0–8.5)
eGFR: 73 mL/min/{1.73_m2} (ref >59)

## 2023-05-25 LAB — CBC WITH DIFFERENTIAL/PLATELET
Basophils Absolute: 0.1 10*3/uL (ref 0.0–0.2)
Basos: 1 %
EOS (ABSOLUTE): 0.1 10*3/uL (ref 0.0–0.4)
Eos: 2 %
Hematocrit: 42.1 % (ref 34.0–46.6)
Hemoglobin: 14 g/dL (ref 11.1–15.9)
Immature Grans (Abs): 0 10*3/uL (ref 0.0–0.1)
Immature Granulocytes: 0 %
Lymphocytes Absolute: 1.7 10*3/uL (ref 0.7–3.1)
Lymphs: 32 %
MCH: 29.3 pg (ref 26.6–33.0)
MCHC: 33.3 g/dL (ref 31.5–35.7)
MCV: 88 fL (ref 79–97)
Monocytes Absolute: 0.5 10*3/uL (ref 0.1–0.9)
Monocytes: 9 %
Neutrophils Absolute: 2.9 10*3/uL (ref 1.4–7.0)
Neutrophils: 56 %
Platelets: 267 10*3/uL (ref 150–450)
RBC: 4.78 x10E6/uL (ref 3.77–5.28)
RDW: 12.6 % (ref 11.7–15.4)
WBC: 5.2 10*3/uL (ref 3.4–10.8)

## 2023-05-25 LAB — LIPID PANEL
Chol/HDL Ratio: 2.5 {ratio} (ref 0.0–4.4)
Cholesterol, Total: 183 mg/dL (ref 100–199)
HDL: 72 mg/dL (ref >39)
LDL Chol Calc (NIH): 102 mg/dL — ABNORMAL HIGH (ref 0–99)
Triglycerides: 47 mg/dL (ref 0–149)
VLDL Cholesterol Cal: 9 mg/dL (ref 5–40)

## 2023-05-25 LAB — HEMOGLOBIN A1C
Est. average glucose Bld gHb Est-mCnc: 103 mg/dL
Hgb A1c MFr Bld: 5.2 % (ref 4.8–5.6)

## 2023-05-25 LAB — TSH: TSH: 2.15 u[IU]/mL (ref 0.450–4.500)

## 2023-05-30 ENCOUNTER — Telehealth: Payer: Self-pay

## 2023-05-30 NOTE — Telephone Encounter (Signed)
LVM for pt to return my call in regard to her colonoscopy referral.  Thanks, Marcelino Duster CMA

## 2023-06-01 ENCOUNTER — Encounter: Payer: Self-pay | Admitting: Internal Medicine

## 2023-06-01 ENCOUNTER — Other Ambulatory Visit: Payer: Self-pay | Admitting: Internal Medicine

## 2023-06-01 ENCOUNTER — Ambulatory Visit: Payer: 59 | Admitting: Internal Medicine

## 2023-06-01 VITALS — BP 104/62 | HR 90 | Ht 67.0 in | Wt 187.0 lb

## 2023-06-01 DIAGNOSIS — R310 Gross hematuria: Secondary | ICD-10-CM

## 2023-06-01 DIAGNOSIS — N3001 Acute cystitis with hematuria: Secondary | ICD-10-CM

## 2023-06-01 LAB — POCT URINALYSIS DIPSTICK
Bilirubin, UA: NEGATIVE
Glucose, UA: NEGATIVE
Ketones, UA: NEGATIVE
Nitrite, UA: NEGATIVE
Protein, UA: NEGATIVE
Spec Grav, UA: 1.015 (ref 1.010–1.025)
Urobilinogen, UA: 0.2 U/dL
pH, UA: 7 (ref 5.0–8.0)

## 2023-06-01 MED ORDER — SULFAMETHOXAZOLE-TRIMETHOPRIM 800-160 MG PO TABS
1.0000 | ORAL_TABLET | Freq: Two times a day (BID) | ORAL | 0 refills | Status: AC
Start: 2023-06-01 — End: 2023-06-08

## 2023-06-01 NOTE — Progress Notes (Signed)
Date:  06/01/2023   Name:  Vanessa Mcneil   DOB:  [birth year]   MRN:  [id number]   Chief Complaint: Urinary Tract Infection (Started yesterday. Pelvic cramps, period ended 2 days ago, urgency, burning when urinating. ( Pink colored urine today))  Urinary Tract Infection  This is a new problem. The current episode started yesterday. The problem occurs every urination. The quality of the pain is described as burning. The pain is mild. There has been no fever. Associated symptoms include frequency, nausea and urgency. Pertinent negatives include no hematuria. She has tried increased fluids for the symptoms.    Review of Systems  Gastrointestinal:  Positive for nausea.  Genitourinary:  Positive for frequency and urgency. Negative for hematuria.     Lab Results  Component Value Date   NA 140 05/24/2023   K 4.0 05/24/2023   CO2 23 05/24/2023   GLUCOSE 89 05/24/2023   BUN 16 05/24/2023   CREATININE 0.96 05/24/2023   CALCIUM 9.8 05/24/2023   EGFR 73 05/24/2023   GFRNONAA 92 05/14/2017   Lab Results  Component Value Date   CHOL 183 05/24/2023   HDL 72 05/24/2023   LDLCALC 102 (H) 05/24/2023   TRIG 47 05/24/2023   CHOLHDL 2.5 05/24/2023   Lab Results  Component Value Date   TSH 2.150 05/24/2023   Lab Results  Component Value Date   HGBA1C 5.2 05/24/2023   Lab Results  Component Value Date   WBC 5.2 05/24/2023   HGB 14.0 05/24/2023   HCT 42.1 05/24/2023   MCV 88 05/24/2023   PLT 267 05/24/2023   Lab Results  Component Value Date   ALT 16 05/24/2023   AST 14 05/24/2023   ALKPHOS 69 05/24/2023   BILITOT 0.8 05/24/2023   No results found for: "25OHVITD2", "25OHVITD3", "VD25OH"   Patient Active Problem List   Diagnosis Date Noted   Hot flushes, perimenopausal 05/03/2021   Anxiety 02/07/2015   Family history of malignant melanoma 02/07/2015   Oral herpes simplex, 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   Drug use: No     Medication list has been reviewed and updated.  Current Meds  Medication Sig   ALPRAZolam (XANAX) 0.25 MG tablet Take 1 tablet (0.25 mg total) by mouth 2 (two) times daily as needed for anxiety.   buPROPion (WELLBUTRIN XL) 150 MG 24 hr tablet TAKE 3 TABLETS BY MOUTH DAILY.   sulfamethoxazole-trimethoprim (BACTRIM DS) 800-160 MG tablet Take 1 tablet by mouth 2 (two) times daily for 7 days.       06/01/2023    9:41 AM 05/24/2023    8:05 AM 08/04/2022    9:53 AM 06/16/2022    3:32 PM  GAD 7 : Generalized Anxiety Score  Nervous, Anxious, on Edge 0 0 2 0  Control/stop worrying 0 0 2 0  Worry too much - different things 0 0 1 0  Trouble relaxing 0 0 1 0  Restless 0 0 0 0  Easily annoyed or irritable 0 0 1 0  Afraid - awful might happen 0 0 0 0  Total GAD 7 Score 0 0 7 0  Anxiety Difficulty Not difficult at all Not difficult at all Somewhat difficult Not difficult at all       06/01/2023    9:41 AM 05/24/2023    8:05 AM 08/04/2022    9:53 AM  Depression screen PHQ 2/9  Decreased Interest 0 0 2  Down, Depressed, Hopeless 0 0 0  PHQ - 2 Score 0 0 2  Altered sleeping 0  0  Tired, decreased energy 0  2  Change in appetite 0  0  Feeling bad or failure about yourself  0  1  Trouble concentrating 0  1  Moving slowly or fidgety/restless 0  0  Suicidal thoughts 0  0  PHQ-9 Score 0  6  Difficult doing work/chores Not difficult at all  Somewhat difficult    BP Readings from Last 3 Encounters:  06/01/23 104/62  05/24/23 120/80  08/04/22 110/78    Physical Exam Constitutional:       Appearance: Normal appearance.  Cardiovascular:     Rate and Rhythm: Normal rate and regular rhythm.  Pulmonary:     Effort: Pulmonary effort is normal.     Breath sounds: No wheezing or rhonchi.  Abdominal:     General: Abdomen is flat.     Palpations: Abdomen is soft.     Tenderness: There is abdominal tenderness. There is no right CVA tenderness, left CVA tenderness or guarding.  Skin:    General: Skin is warm and dry.  Neurological:     Mental Status: She is alert.     Wt Readings from Last 3 Encounters:  06/01/23 187 lb (84.8 kg)  05/24/23 187 lb (84.8 kg)  08/04/22 178 lb 6.4 oz (80.9 kg)    BP 104/62   Pulse 90   Ht 5\' 7"  (1.702 m)   Wt 187 lb (84.8 kg)   LMP 05/25/2023 (Approximate)   SpO2 99%   BMI 29.29 kg/m   Assessment and Plan:  Problem List Items Addressed This Visit   None Visit Diagnoses       Gross hematuria    -  Primary   Relevant Orders   POCT urinalysis dipstick (Completed)   Urine Culture     Acute cystitis with hematuria       will obtain culture, push fluids change therapy based on culture results   Relevant Medications   sulfamethoxazole-trimethoprim (BACTRIM DS) 800-160 MG tablet       No follow-ups on file.    Reubin Milan, MD [facility] [place]

## 2023-06-04 ENCOUNTER — Encounter: Payer: Self-pay | Admitting: Internal Medicine

## 2023-06-04 LAB — URINE CULTURE

## 2023-06-06 ENCOUNTER — Encounter: Payer: Self-pay | Admitting: *Deleted

## 2023-06-26 ENCOUNTER — Encounter: Payer: Self-pay | Admitting: Student

## 2023-06-26 ENCOUNTER — Ambulatory Visit: Admitting: Student

## 2023-06-26 VITALS — BP 106/80 | HR 90 | Ht 67.0 in | Wt 183.4 lb

## 2023-06-26 DIAGNOSIS — R300 Dysuria: Secondary | ICD-10-CM

## 2023-06-26 LAB — POCT URINALYSIS DIPSTICK
Bilirubin, UA: NEGATIVE
Blood, UA: NEGATIVE
Glucose, UA: NEGATIVE
Leukocytes, UA: NEGATIVE
Nitrite, UA: NEGATIVE
Protein, UA: NEGATIVE
Spec Grav, UA: 1.01 (ref 1.010–1.025)
Urobilinogen, UA: 0.2 U/dL
pH, UA: 5 (ref 5.0–8.0)

## 2023-06-26 NOTE — Assessment & Plan Note (Signed)
 Well appearing with stable vitals today. Unfortunately unable to interpret her POCT urinalysis due accidental contamination while she was giving sample today. Suspect urethritis vs acute cystis. Advised she try using a menstrual cup rather than a sock she cannot use a tampons. Urine dipstick at last visit positive for blood, possibly due to pt menstruating at last visit.   Obtain formal UA with reflex micro and urine culture. Patient will bring sample to [facility] tomorrow morning.

## 2023-06-26 NOTE — Progress Notes (Signed)
 Established Patient Office Visit  Subjective   Patient ID: Vanessa Mcneil, female    DOB: November 03, 1975  Age: 48 y.o. MRN: [id number]  Chief Complaint  Patient presents with   Urinary Tract Infection    UTI symptoms started 2 days ago. 06/24/23. Painful urination and frequency.     Patient presents today with 2 days of bladder discomfort and and dysuria. She notes red tinge with wiping after urinating yesterday. Notes symptoms are improved today when giving sam[le however she unfortunately dropped sample in the urinal before completing her sample today. Notes possible irritation from using socks instead of a tampon due to leaking during most recent menstrual period a few days ago. Has been taking AZO for the past 2 days and increasing fluid intake. Feels symptoms are much improved today. Denies fever, chills, gross hematuria.  Had recent visit on 06/01/2023 with PCP for UTI and completed course of bactrim with complete resolution of her symptoms.  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  HENT:     Mouth/Throat:     Mouth: Mucous membranes are moist.     Pharynx: Oropharynx is clear.  Cardiovascular:     Rate and Rhythm: Normal rate and regular rhythm.  Pulmonary:     Effort: Pulmonary effort is normal.     Breath sounds: No rhonchi or rales.  Abdominal:     General: Abdomen is flat. Bowel sounds are normal. There is no distension.      Palpations: Abdomen is soft.     Tenderness: There is no abdominal tenderness. There is no right CVA tenderness or left CVA tenderness.  Musculoskeletal:        General: Normal range of motion.     Right lower leg: No edema.     Left lower leg: No edema.  Skin:    General: Skin is warm and dry.     Capillary Refill: Capillary refill takes less than 2 seconds.  Neurological:     General: No focal deficit present.     Mental Status: She is alert and oriented to person, place, and time.  Psychiatric:        Mood and Affect: Mood normal.        Behavior: Behavior normal.        06/26/2023    4:34 PM 06/01/2023    9:41 AM 05/24/2023    8:05 AM  Depression screen PHQ 2/9  Decreased Interest 0 0 0  Down, Depressed, Hopeless 0 0 0  PHQ - 2 Score 0 0 0  Altered sleeping 0 0   Tired, decreased energy 0 0   Change in appetite 0 0   Feeling bad or failure about yourself  0 0   Trouble concentrating 0 0   Moving slowly or fidgety/restless 0 0   Suicidal  thoughts 0 0   PHQ-9 Score 0 0   Difficult doing work/chores Not difficult at all Not difficult at all        06/26/2023    4:34 PM 06/01/2023    9:41 AM 05/24/2023    8:05 AM 08/04/2022    9:53 AM  GAD 7 : Generalized Anxiety Score  Nervous, Anxious, on Edge 0 0 0 2  Control/stop worrying 0 0 0 2  Worry too much - different things 0 0 0 1  Trouble relaxing 0 0 0 1  Restless 0 0 0 0  Easily annoyed or irritable 0 0 0 1  Afraid - awful might happen 0 0 0 0  Total GAD 7 Score 0 0 0 7  Anxiety Difficulty Not difficult at all Not difficult at all Not difficult at all Somewhat difficult    Results for orders placed or performed in visit on 06/26/23  POCT urinalysis dipstick  Result Value Ref Range   Color, UA Yellow    Clarity, UA Clear    Glucose, UA Negative Negative   Bilirubin, UA Negative    Ketones, UA Postitive    Spec Grav, UA 1.010 1.010 - 1.025   Blood, UA Negative    pH, UA 5.0 5.0 - 8.0   Protein, UA Negative  Negative   Urobilinogen, UA 0.2 0.2 or 1.0 E.U./dL   Nitrite, UA Negative    Leukocytes, UA Negative Negative   Appearance     Odor      Last CBC Lab Results  Component Value Date   WBC 5.2 05/24/2023   HGB 14.0 05/24/2023   HCT 42.1 05/24/2023   MCV 88 05/24/2023   MCH 29.3 05/24/2023   RDW 12.6 05/24/2023   PLT 267 05/24/2023   Last metabolic panel Lab Results  Component Value Date   GLUCOSE 89 05/24/2023   NA 140 05/24/2023   K 4.0 05/24/2023   CL 102 05/24/2023   CO2 23 05/24/2023   BUN 16 05/24/2023   CREATININE 0.96 05/24/2023   EGFR 73 05/24/2023   CALCIUM 9.8 05/24/2023   PROT 6.6 05/24/2023   ALBUMIN 4.5 05/24/2023   LABGLOB 2.1 05/24/2023   AGRATIO 2.4 (H) 05/12/2022   BILITOT 0.8 05/24/2023   ALKPHOS 69 05/24/2023   AST 14 05/24/2023   ALT 16 05/24/2023    Assessment & Plan:  Dysuria Assessment & Plan: Well appearing with stable vitals today. Unfortunately unable to interpret her POCT urinalysis due accidental contamination while she was giving sample today. Suspect urethritis vs acute cystis. Advised she try using a menstrual cup rather than a sock she cannot use a tampons. Urine dipstick at last visit positive for blood, possibly due to pt menstruating at last visit.   Obtain formal UA with reflex micro and urine culture. Patient will bring sample to [facility] tomorrow morning.  Orders: -     POCT urinalysis dipstick -     Urine Culture -     Urinalysis, Routine w reflex microscopic     Return if symptoms worsen or fail to improve.    Quincy Simmonds, MD

## 2023-06-29 NOTE — Addendum Note (Signed)
 Addended by: Quincy Simmonds on: 06/29/2023 04:20 PM   Modules accepted: Level of Service

## 2023-08-28 ENCOUNTER — Ambulatory Visit: Payer: Self-pay

## 2023-08-28 NOTE — Telephone Encounter (Signed)
 Noted  KP

## 2023-08-28 NOTE — Telephone Encounter (Signed)
 Copied from CRM [id number]. Topic: Clinical - Red Word Triage >> Aug 28, 2023  3:20 PM Vanessa Mcneil wrote: Red Word that prompted transfer to Nurse Triage: mainly left, started hurting. No new exercise. Arm is extremely sore, unable to put clothes.  Chief Complaint: Bilateral arm pain Symptoms: Difficulty getting dressed/undressed Frequency: 3-4 weeks Pertinent Negatives: Patient denies weakness Disposition: [] ED /[] Urgent Care (no appt availability in office) / [x] Appointment(In office/virtual)/ []  [facility]/ [] Home Care/ [] Refused Recommended Disposition /[]  Mobile Bus/ []  Follow-up with PCP Additional Notes: Patient called in to report bilateral arm pain. Patient stated pain is located on the upper, backside of her arms. Patient stated left side is worse. Patient stated pain started 3-4 weeks ago. Patient is unsure of the cause, but stated it feels like she pulled a muscle. Patient denied loss of sensation/weakness, redness and swelling. Advised patient to be seen within 3 days, per protocol. Scheduled with PCP tomorrow morning. Provided care advice and instructed patient to call back if symptoms worsen. Patient complied.   Reason for Disposition  [1] MODERATE pain (e.g., interferes with normal activities) AND [2] present > 3 days  Answer Assessment - Initial Assessment Questions 1. ONSET: "When did the pain start?"     3-4 weeks ago  2. LOCATION: "Where is the pain located?"     Bilateral arms, left hurts worse, upper backside of arms- inch below shoulder blade 3. PAIN: "How bad is the pain?" (Scale 1-10; or mild, moderate, severe)   - MILD (1-3): Doesn't interfere with normal activities.   - MODERATE (4-7): Interferes with normal activities (e.g., work or school) or awakens from sleep.   - SEVERE (8-10): Excruciating pain, unable to do any normal activities, unable to hold a cup of water.     States pain is not unbearable and she is able to move her arms, taking her  shirt off/getting dressed is hard, rates pain a 7 4. WORK OR EXERCISE: "Has there been any recent work or exercise that involved this part of the body?"     Denies 5. CAUSE: "What do you think is causing the arm pain?"     Unknown, maybe pulled something 6. OTHER SYMPTOMS: "Do you have any other symptoms?" (e.g., neck pain, swelling, rash, fever, numbness, weakness)     Denies loss of sensation/weakness, denies swelling, denies redness The more she uses her arms, the less it hurts  Protocols used: Arm Pain-A-AH

## 2023-08-29 ENCOUNTER — Ambulatory Visit: Admitting: Internal Medicine

## 2023-08-29 ENCOUNTER — Encounter: Payer: Self-pay | Admitting: Internal Medicine

## 2023-08-29 VITALS — BP 112/70 | HR 90 | Ht 67.0 in | Wt 186.0 lb

## 2023-08-29 DIAGNOSIS — M7522 Bicipital tendinitis, left shoulder: Secondary | ICD-10-CM | POA: Diagnosis not present

## 2023-08-29 MED ORDER — METHOCARBAMOL 500 MG PO TABS
500.0000 mg | ORAL_TABLET | Freq: Every day | ORAL | 0 refills | Status: DC
Start: 2023-08-29 — End: 2023-09-28

## 2023-08-29 NOTE — Progress Notes (Signed)
 Date:  08/29/2023   Name:  Vanessa Mcneil   DOB:  1975/08/24   MRN:  [id number]   Chief Complaint: Arm Pain (3.5 weeks ago, both upper arm aching pain, can not move arms back)  Arm Pain  Incident onset: about three weeks ago. The pain is present in the left shoulder and right shoulder. The quality of the pain is described as aching. The pain does not radiate. The pain is moderate (worse on left, mild on right). Pertinent negatives include no numbness. The symptoms are aggravated by movement and lifting. She has tried NSAIDs for the symptoms. The treatment provided mild relief.    Review of Systems  Constitutional:  Negative for chills and fatigue.  Musculoskeletal:  Positive for myalgias.  Neurological:  Negative for dizziness, weakness and numbness.  Psychiatric/Behavioral:  Negative for dysphoric mood and sleep disturbance. The patient is not nervous/anxious.      Lab Results  Component Value Date   NA 140 05/24/2023   K 4.0 05/24/2023   CO2 23 05/24/2023   GLUCOSE 89 05/24/2023   BUN 16 05/24/2023   CREATININE 0.96 05/24/2023   CALCIUM 9.8 05/24/2023   EGFR 73 05/24/2023   GFRNONAA 92 05/14/2017   Lab Results  Component Value Date   CHOL 183 05/24/2023   HDL 72 05/24/2023   LDLCALC 102 (H) 05/24/2023   TRIG 47 05/24/2023   CHOLHDL 2.5 05/24/2023   Lab Results  Component Value Date   TSH 2.150 05/24/2023   Lab Results  Component Value Date   HGBA1C 5.2 05/24/2023   Lab Results  Component Value Date   WBC 5.2 05/24/2023   HGB 14.0 05/24/2023   HCT 42.1 05/24/2023   MCV 88 05/24/2023   PLT 267 05/24/2023   Lab Results  Component Value Date   ALT 16 05/24/2023   AST 14 05/24/2023   ALKPHOS 69 05/24/2023   BILITOT 0.8 05/24/2023   No results found for: "25OHVITD2", "25OHVITD3", "VD25OH"   Patient Active Problem List   Diagnosis Date Noted   Dysuria 06/26/2023   Hot flushes, perimenopausal 05/03/2021   Anxiety 02/07/2015   Family history of  malignant melanoma 02/07/2015   Oral herpes simplex, 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   Drug use: No     Medication list has been reviewed and updated.  Current Meds  Medication Sig   ALPRAZolam  (XANAX ) 0.25 MG tablet Take 1 tablet (0.25 mg total) by mouth 2 (two) times daily as needed for anxiety.   buPROPion  (WELLBUTRIN  XL) 150 MG 24 hr tablet TAKE 3 TABLETS BY MOUTH DAILY.   methocarbamol (ROBAXIN) 500 MG tablet Take 1 tablet (500 mg total) by mouth at bedtime.       06/26/2023    4:34 PM 06/01/2023    9:41 AM 05/24/2023    8:05 AM 08/04/2022    9:53 AM  GAD 7 : Generalized Anxiety Score  Nervous, Anxious, on Edge 0  0 0 2  Control/stop worrying 0 0 0 2  Worry too much - different things 0 0 0 1  Trouble relaxing 0 0 0 1  Restless 0 0 0 0  Easily annoyed or irritable 0 0 0 1  Afraid - awful might happen 0 0 0 0  Total GAD 7 Score 0 0 0 7  Anxiety Difficulty Not difficult at all Not difficult at all Not difficult at all Somewhat difficult       06/26/2023    4:34 PM 06/01/2023    9:41 AM 05/24/2023    8:05 AM  Depression screen PHQ 2/9  Decreased Interest 0 0 0  Down, Depressed, Hopeless 0 0 0  PHQ - 2 Score 0 0 0  Altered sleeping 0 0   Tired, decreased energy 0 0   Change in appetite 0 0   Feeling bad or failure about yourself  0 0   Trouble concentrating 0 0   Moving slowly or fidgety/restless 0 0   Suicidal thoughts 0 0   PHQ-9 Score 0 0   Difficult doing work/chores Not difficult at all Not difficult at all     BP  Readings from Last 3 Encounters:  08/29/23 112/70  06/26/23 106/80  06/01/23 104/62    Physical Exam Vitals and nursing note reviewed.  Constitutional:      General: She is not in acute distress.    Appearance: Normal appearance. She is well-developed.  HENT:     Head: Normocephalic and atraumatic.  Pulmonary:     Effort: Pulmonary effort is normal. No respiratory distress.  Musculoskeletal:     Right shoulder: No swelling, effusion or tenderness. Normal range of motion. Normal strength.     Left shoulder: Tenderness present. No swelling or effusion. Normal range of motion. Normal strength.     Right elbow: Normal.     Left elbow: Normal.     Right wrist: Normal.     Left wrist: Normal.       Back:     Comments: spasm  Skin:    General: Skin is warm and dry.     Findings: No rash.  Neurological:     Mental Status: She is alert and oriented to person, place, and time.     Cranial Nerves: Cranial nerves 2-12 are intact.     Sensory: Sensation is intact.     Motor: Motor function is intact.     Deep Tendon Reflexes:     Reflex Scores:      Bicep reflexes are 2+ on the right side and 2+ on the left side. Psychiatric:        Mood and Affect: Mood normal.        Behavior: Behavior normal.     Wt Readings from Last 3 Encounters:  08/29/23 186 lb (84.4 kg)  06/26/23 183 lb 6.4 oz (83.2 kg)  06/01/23 187 lb (84.8 kg)    BP 112/70   Pulse 90   Ht 5\' 7"  (1.702 m)   Wt 186 lb (84.4 kg)   LMP 08/22/2023 (Approximate)   SpO2 96%   BMI 29.13 kg/m   Assessment and Plan:  Problem List Items Addressed This Visit   None Visit Diagnoses       Biceps tendonitis, left    -  Primary   Recommend robaxin at night for trapezious muscle spasm contributing Advil 800 mg tid 1-2 weeks Use heat on shoulder and upper arm   Relevant Medications  methocarbamol (ROBAXIN) 500 MG tablet       No follow-ups on file.    Sheron Dixons, MD [facility] [place]

## 2023-08-29 NOTE — Patient Instructions (Signed)
Ibuprofen 800 mg  three times a day

## 2023-08-31 ENCOUNTER — Encounter: Payer: Self-pay | Admitting: Internal Medicine

## 2023-08-31 ENCOUNTER — Ambulatory Visit: Admitting: Internal Medicine

## 2023-08-31 VITALS — BP 116/72 | HR 83 | Ht 67.0 in | Wt 182.5 lb

## 2023-08-31 DIAGNOSIS — S90122A Contusion of left lesser toe(s) without damage to nail, initial encounter: Secondary | ICD-10-CM

## 2023-08-31 NOTE — Progress Notes (Signed)
 Date:  08/31/2023   Name:  Vanessa Mcneil   DOB:  01-09-1976   MRN:  [id number]   Chief Complaint: Toe Pain (Patient stated she kicked a stool  Wednesday eveningand injured her toe on the left foot, toe swollen and purple, heard a crack when it happened)  HPI Toe pain - accidentally kicked a stool leg - striking her left middle toe.  She heard a pop and then it became bruised.  It is actually improving today, she is able to bend the toe without pain.  No other concerns.  Review of Systems  Musculoskeletal:  Negative for gait problem.     Lab Results  Component Value Date   NA 140 05/24/2023   K 4.0 05/24/2023   CO2 23 05/24/2023   GLUCOSE 89 05/24/2023   BUN 16 05/24/2023   CREATININE 0.96 05/24/2023   CALCIUM 9.8 05/24/2023   EGFR 73 05/24/2023   GFRNONAA 92 05/14/2017   Lab Results  Component Value Date   CHOL 183 05/24/2023   HDL 72 05/24/2023   LDLCALC 102 (H) 05/24/2023   TRIG 47 05/24/2023   CHOLHDL 2.5 05/24/2023   Lab Results  Component Value Date   TSH 2.150 05/24/2023   Lab Results  Component Value Date   HGBA1C 5.2 05/24/2023   Lab Results  Component Value Date   WBC 5.2 05/24/2023   HGB 14.0 05/24/2023   HCT 42.1 05/24/2023   MCV 88 05/24/2023   PLT 267 05/24/2023   Lab Results  Component Value Date   ALT 16 05/24/2023   AST 14 05/24/2023   ALKPHOS 69 05/24/2023   BILITOT 0.8 05/24/2023   No results found for: "25OHVITD2", "25OHVITD3", "VD25OH"   Patient Active Problem List   Diagnosis Date Noted   Dysuria 06/26/2023   Hot flushes, perimenopausal 05/03/2021   Anxiety 02/07/2015   Family history of malignant melanoma 02/07/2015   Oral herpes simplex, 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   Drug use: No     Medication list has been reviewed and updated.  Current Meds  Medication Sig   ALPRAZolam  (XANAX ) 0.25 MG tablet Take 1 tablet (0.25 mg total) by mouth 2 (two) times daily as needed for anxiety.   buPROPion  (WELLBUTRIN  XL) 150 MG 24 hr tablet TAKE 3 TABLETS BY MOUTH DAILY.   methocarbamol (ROBAXIN) 500 MG tablet Take 1 tablet (500 mg total) by mouth at bedtime.       08/31/2023   10:32 AM 06/26/2023    4:34 PM 06/01/2023    9:41 AM 05/24/2023    8:05 AM  GAD 7 : Generalized Anxiety Score  Nervous, Anxious, on Edge 0 0 0 0  Control/stop worrying 0 0 0 0  Worry too much - different things 0 0 0 0  Trouble relaxing 0 0 0 0  Restless 0 0 0 0  Easily annoyed or irritable 0 0 0 0  Afraid - awful  might happen 0 0 0 0  Total GAD 7 Score 0 0 0 0  Anxiety Difficulty Not difficult at all Not difficult at all Not difficult at all Not difficult at all       08/31/2023   10:32 AM 06/26/2023    4:34 PM 06/01/2023    9:41 AM  Depression screen PHQ 2/9  Decreased Interest 0 0 0  Down, Depressed, Hopeless 0 0 0  PHQ - 2 Score 0 0 0  Altered sleeping 0 0 0  Tired, decreased energy 0 0 0  Change in appetite 0 0 0  Feeling bad or failure about yourself  0 0 0  Trouble concentrating 0 0 0  Moving slowly or fidgety/restless 0 0 0  Suicidal thoughts 0 0 0  PHQ-9 Score 0 0 0  Difficult doing work/chores Not difficult at all Not difficult at all Not difficult at all    BP Readings from Last 3 Encounters:  08/31/23 116/72  08/29/23 112/70  06/26/23 106/80    Physical Exam Vitals and nursing note reviewed.  Constitutional:      General: She is not in acute distress.    Appearance: She is well-developed.  HENT:     Head: Normocephalic and atraumatic.  Pulmonary:      Effort: Pulmonary effort is normal. No respiratory distress.  Musculoskeletal:       Feet:  Feet:     Comments: Bruising, mildly tender and mildly swollen Full active ROM Skin:    General: Skin is warm and dry.     Findings: No rash.  Neurological:     Mental Status: She is alert and oriented to person, place, and time.  Psychiatric:        Mood and Affect: Mood normal.        Behavior: Behavior normal.     Wt Readings from Last 3 Encounters:  08/31/23 182 lb 8 oz (82.8 kg)  08/29/23 186 lb (84.4 kg)  06/26/23 183 lb 6.4 oz (83.2 kg)    BP 116/72   Pulse 83   Ht 5\' 7"  (1.702 m)   Wt 182 lb 8 oz (82.8 kg)   LMP 08/22/2023 (Approximate)   SpO2 98%   BMI 28.58 kg/m   Assessment and Plan:  Problem List Items Addressed This Visit   None Visit Diagnoses       Contusion of middle toe, left, initial encounter    -  Primary   fracture unlikely given minimal pain and intact ROM rec supportive shoes, Advil as needed       No follow-ups on file.    Sheron Dixons, MD [facility] [place]

## 2023-09-22 ENCOUNTER — Other Ambulatory Visit: Payer: Self-pay | Admitting: Internal Medicine

## 2023-09-22 DIAGNOSIS — F419 Anxiety disorder, unspecified: Secondary | ICD-10-CM

## 2023-09-25 ENCOUNTER — Other Ambulatory Visit: Payer: Self-pay | Admitting: Internal Medicine

## 2023-09-25 DIAGNOSIS — M7522 Bicipital tendinitis, left shoulder: Secondary | ICD-10-CM

## 2023-09-25 NOTE — Telephone Encounter (Signed)
 Requested Prescriptions  Pending Prescriptions Disp Refills   buPROPion  (WELLBUTRIN  XL) 150 MG 24 hr tablet [Pharmacy Med Name: BUPROPION  HCL XL 150 MG TABLET] 270 tablet 0    Sig: TAKE 3 TABLETS BY MOUTH DAILY     Psychiatry: Antidepressants - bupropion  Passed - 09/25/2023 10:14 AM      Passed - Cr in normal range and within 360 days    Creatinine, Ser  Date Value Ref Range Status  05/24/2023 0.96 0.57 - 1.00 mg/dL Final         Passed - AST in normal range and within 360 days    AST  Date Value Ref Range Status  05/24/2023 14 0 - 40 IU/L Final         Passed - ALT in normal range and within 360 days    ALT  Date Value Ref Range Status  05/24/2023 16 0 - 32 IU/L Final         Passed - Last BP in normal range    BP Readings from Last 1 Encounters:  08/31/23 116/72         Passed - Valid encounter within last 6 months    Recent Outpatient Visits           3 weeks ago Contusion of middle toe, left, initial encounter   [facility] at [facility], [place], [place]   3 weeks ago Biceps tendonitis, left    Primary Care & Sports Medicine at [facility], [place], MD   3 months ago Dysuria   [facility] at [facility], MD   3 months ago Gross hematuria   [facility] at [facility], [place], MD   4 months ago Annual physical exam   [facility] Medicine at [facility], [place], MD       Future Appointments             In 8 months Barnetta Liberty, MD [facility] Medicine at [facility], [facility]

## 2023-09-27 NOTE — Telephone Encounter (Signed)
 Requested medications are due for refill today.  yes  Requested medications are on the active medications list.  yes  Last refill. 08/29/2023 #30 0 rf  Future visit scheduled.   yes  Notes to clinic.  Refill not delegated.    Requested Prescriptions  Pending Prescriptions Disp Refills   methocarbamol  (ROBAXIN ) 500 MG tablet [Pharmacy Med Name: METHOCARBAMOL  500 MG TABLET] 30 tablet 0    Sig: TAKE 1 TABLET BY MOUTH AT BEDTIME.     Not Delegated - Analgesics:  Muscle Relaxants Failed - 09/27/2023 10:26 AM      Failed - This refill cannot be delegated      Passed - Valid encounter within last 6 months    Recent Outpatient Visits           3 weeks ago Contusion of middle toe, left, initial encounter   [facility] Medicine at [facility], [place], MD   4 weeks ago Biceps tendonitis, left   [facility] at [facility], [place], MD   3 months ago Dysuria   [facility] Primary Care & [facility] Medicine at [facility], MD   3 months ago Gross hematuria   [facility] Primary Care & Sports Medicine at [facility], [place], MD   4 months ago Annual physical exam   [facility] Care & [facility] Medicine at [facility], [place], MD       Future Appointments             In 7 months Barnetta Liberty, MD [facility] Medicine at [facility], [facility]

## 2023-09-27 NOTE — Telephone Encounter (Signed)
 Called patient about RX refill, she said her shoulder is not any better and wanted to schedule an office visit with you this coming up week. Appointment has been scheduled.

## 2023-10-03 ENCOUNTER — Encounter: Payer: Self-pay | Admitting: Internal Medicine

## 2023-10-03 ENCOUNTER — Ambulatory Visit: Admitting: Internal Medicine

## 2023-10-03 VITALS — BP 122/76 | HR 96 | Ht 67.0 in | Wt 182.0 lb

## 2023-10-03 DIAGNOSIS — M778 Other enthesopathies, not elsewhere classified: Secondary | ICD-10-CM | POA: Diagnosis not present

## 2023-10-03 MED ORDER — PREDNISONE 10 MG PO TABS
ORAL_TABLET | ORAL | 0 refills | Status: AC
Start: 2023-10-03 — End: 2023-10-09

## 2023-10-03 NOTE — Progress Notes (Signed)
 Date:  10/03/2023   Name:  Vanessa Mcneil   DOB:  10-11-1975   MRN:  [id number]   Chief Complaint: Shoulder Pain (Left. Was seen on 08/29/2023 and dxed with biceps tendonitis, left.Pt says symptoms have not improved.)  Shoulder Pain  The pain is present in the left shoulder. This is a new problem. The current episode started more than 1 month ago. The problem occurs constantly. The problem has been gradually improving. The quality of the pain is described as aching and burning. The pain is mild. Pertinent negatives include no numbness.    Review of Systems  Constitutional:  Negative for chills and fatigue.  Musculoskeletal:  Positive for myalgias (left upper arm).  Neurological:  Negative for weakness and numbness.     Lab Results  Component Value Date   NA 140 05/24/2023   K 4.0 05/24/2023   CO2 23 05/24/2023   GLUCOSE 89 05/24/2023   BUN 16 05/24/2023   CREATININE 0.96 05/24/2023   CALCIUM 9.8 05/24/2023   EGFR 73 05/24/2023   GFRNONAA 92 05/14/2017   Lab Results  Component Value Date   CHOL 183 05/24/2023   HDL 72 05/24/2023   LDLCALC 102 (H) 05/24/2023   TRIG 47 05/24/2023   CHOLHDL 2.5 05/24/2023   Lab Results  Component Value Date   TSH 2.150 05/24/2023   Lab Results  Component Value Date   HGBA1C 5.2 05/24/2023   Lab Results  Component Value Date   WBC 5.2 05/24/2023   HGB 14.0 05/24/2023   HCT 42.1 05/24/2023   MCV 88 05/24/2023   PLT 267 05/24/2023   Lab Results  Component Value Date   ALT 16 05/24/2023   AST 14 05/24/2023   ALKPHOS 69 05/24/2023   BILITOT 0.8 05/24/2023   No results found for: MARIEN BOLLS, VD25OH   Patient Active Problem List   Diagnosis Date Noted   Dysuria 06/26/2023   Hot flushes, perimenopausal 05/03/2021   Anxiety 02/07/2015   Family history of malignant melanoma 02/07/2015   Oral herpes simplex, 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   Drug use: No     Medication list has been reviewed and updated.  Current Meds  Medication Sig   ALPRAZolam  (XANAX ) 0.25 MG tablet Take 1 tablet (0.25 mg total) by mouth 2 (two) times daily as needed for anxiety.   buPROPion  (WELLBUTRIN  XL) 150 MG 24 hr tablet TAKE 3 TABLETS BY MOUTH DAILY   predniSONE (DELTASONE) 10 MG tablet Take 6 tablets (60 mg total) by mouth daily with breakfast for 1 day, THEN 5 tablets (50 mg total) daily with breakfast for 1 day, THEN 4 tablets (40 mg total) daily with breakfast for 1 day, THEN 3 tablets (30 mg total) daily with breakfast for 1 day, THEN 2 tablets (20 mg total) daily with breakfast for 1 day, THEN 1 tablet (10 mg total) daily with breakfast for 1 day.  10/03/2023    8:19 AM 08/31/2023   10:32 AM 06/26/2023    4:34 PM 06/01/2023    9:41 AM  GAD 7 : Generalized Anxiety Score  Nervous, Anxious, on Edge 0 0 0 0  Control/stop worrying 0 0 0 0  Worry too much - different things 0 0 0 0  Trouble relaxing 0 0 0 0  Restless 0 0 0 0  Easily annoyed or irritable 0 0 0 0  Afraid - awful might happen 0 0 0 0  Total GAD 7 Score 0 0 0 0  Anxiety Difficulty Not difficult at all Not difficult at all Not difficult at all Not difficult at all       10/03/2023    8:19 AM 08/31/2023   10:32 AM 06/26/2023    4:34 PM  Depression screen PHQ 2/9  Decreased Interest 0 0 0  Down, Depressed, Hopeless 0 0 0  PHQ - 2 Score 0 0 0  Altered sleeping 0 0 0  Tired, decreased energy 0 0 0  Change in appetite 0 0 0  Feeling bad or failure about yourself  0 0 0  Trouble concentrating 0 0  0  Moving slowly or fidgety/restless 0 0 0  Suicidal thoughts 0 0 0  PHQ-9 Score 0 0 0  Difficult doing work/chores Not difficult at all Not difficult at all Not difficult at all    BP Readings from Last 3 Encounters:  10/03/23 122/76  08/31/23 116/72  08/29/23 112/70    Physical Exam Vitals and nursing note reviewed.  Constitutional:      General: She is not in acute distress.    Appearance: She is well-developed.  HENT:     Head: Normocephalic and atraumatic.  Pulmonary:     Effort: Pulmonary effort is normal. No respiratory distress.   Musculoskeletal:     Left shoulder: Tenderness present. Normal range of motion.       Arms:     Cervical back: Normal. No spasms. Normal range of motion.   Skin:    General: Skin is warm and dry.     Findings: No rash.   Neurological:     Mental Status: She is alert and oriented to person, place, and time.     Sensory: Sensation is intact.     Motor: No weakness.   Psychiatric:        Mood and Affect: Mood normal.        Behavior: Behavior normal.     Wt Readings from Last 3 Encounters:  10/03/23 182 lb (82.6 kg)  08/31/23 182 lb 8 oz (82.8 kg)  08/29/23 186 lb (84.4 kg)    BP 122/76   Pulse 96   Ht 5' 7 (1.702 m)   Wt 182 lb (82.6 kg)   SpO2 97%   BMI 28.51 kg/m   Assessment and Plan:  Problem List Items Addressed This Visit   None Visit Diagnoses       Tendinitis of left shoulder    -  Primary   improving gradually with no worrisome features will give steroid taper and recommend Ice or Heat to aid symptom relief   Relevant Medications   predniSONE (DELTASONE) 10 MG tablet       No follow-ups on file.    Leita HILARIO Adie, MD [facility] Medicine [place]

## 2023-10-04 ENCOUNTER — Telehealth: Payer: Self-pay | Admitting: Internal Medicine

## 2023-10-04 NOTE — Telephone Encounter (Signed)
 Called pt let her know [facility] and our records in her chart does not show MMR vaccine. Told pt she had a titer done in 2023 for measles and rubella. Told pt that would be a childhood vaccine. Pt stated that she is pretty sure she had the vaccine as a child. She was wondering if there was a booster vaccine. Told pt there is not a booster for MMR. Pt verbalized understanding.  KP

## 2023-10-04 NOTE — Telephone Encounter (Signed)
 Copied from CRM [id number]. Topic: General - Other >> Oct 04, 2023 11:45 AM [name] wrote: Reason for CRM: Pt called would like to know if her measles immunization is up to date. Please contact via MyChart.

## 2023-10-30 ENCOUNTER — Other Ambulatory Visit: Payer: Self-pay | Admitting: Internal Medicine

## 2023-10-30 DIAGNOSIS — M7522 Bicipital tendinitis, left shoulder: Secondary | ICD-10-CM

## 2023-11-01 NOTE — Telephone Encounter (Signed)
 Requested medication (s) are due for refill today: n/a  Requested medication (s) are on the active medication list: no  Last refill:  09/28/23  Future visit scheduled: yes  Notes to clinic:  dc'd 10/03/23//Med not delegated to NT to Refuse   Requested Prescriptions  Pending Prescriptions Disp Refills   methocarbamol  (ROBAXIN ) 500 MG tablet [Pharmacy Med Name: METHOCARBAMOL  500 MG TABLET] 30 tablet 0    Sig: TAKE 1 TABLET BY MOUTH AT BEDTIME.     Not Delegated - Analgesics:  Muscle Relaxants Failed - 11/01/2023  8:57 AM      Failed - This refill cannot be delegated      Passed - Valid encounter within last 6 months    Recent Outpatient Visits           4 weeks ago Tendinitis of left shoulder   [place] [facility] at [facility], Leita DEL, MD   2 months ago Contusion of middle toe, left, initial encounter   [facility] at [facility], Leita DEL, MD   2 months ago Biceps tendonitis, left   [facility] at [facility], Leita DEL, MD   4 months ago Dysuria   [facility] at [facility], MD   5 months ago Gross hematuria   [facility] at [facility], Leita DEL, MD       Future Appointments             In 6 months [name], MD [facility] Primary Care & Sports Medicine at [facility], [facility]

## 2023-12-16 ENCOUNTER — Other Ambulatory Visit: Payer: Self-pay | Admitting: Internal Medicine

## 2023-12-16 DIAGNOSIS — F419 Anxiety disorder, unspecified: Secondary | ICD-10-CM

## 2023-12-17 NOTE — Telephone Encounter (Signed)
 Requested Prescriptions  Pending Prescriptions Disp Refills   buPROPion  (WELLBUTRIN  XL) 150 MG 24 hr tablet [Pharmacy Med Name: BUPROPION  HCL XL 150 MG TABLET] 270 tablet 0    Sig: TAKE 3 TABLETS BY MOUTH DAILY     Psychiatry: Antidepressants - bupropion  Passed - 12/17/2023  5:57 PM      Passed - Cr in normal range and within 360 days    Creatinine, Ser  Date Value Ref Range Status  05/24/2023 0.96 0.57 - 1.00 mg/dL Final         Passed - AST in normal range and within 360 days    AST  Date Value Ref Range Status  05/24/2023 14 0 - 40 IU/L Final         Passed - ALT in normal range and within 360 days    ALT  Date Value Ref Range Status  05/24/2023 16 0 - 32 IU/L Final         Passed - Last BP in normal range    BP Readings from Last 1 Encounters:  10/03/23 122/76         Passed - Valid encounter within last 6 months    Recent Outpatient Visits           2 months ago Tendinitis of left shoulder   [place] [facility] at [facility], Leita DEL, MD   3 months ago Contusion of middle toe, left, initial encounter   [facility] at [facility], Leita DEL, MD   3 months ago Biceps tendonitis, left   [facility] Medicine at [facility], Leita DEL, MD   5 months ago Dysuria   [facility] at [facility], MD   6 months ago Gross hematuria   [facility] Primary Care & Sports Medicine at [facility], Leita DEL, MD       Future Appointments             In 5 months [name], MD [facility] Primary Care & Sports Medicine at [facility], [phone] [place]

## 2024-01-18 ENCOUNTER — Other Ambulatory Visit: Payer: Self-pay | Admitting: Internal Medicine

## 2024-01-18 DIAGNOSIS — F419 Anxiety disorder, unspecified: Secondary | ICD-10-CM

## 2024-01-21 NOTE — Telephone Encounter (Signed)
 Requested medications are due for refill today.  yes  Requested medications are on the active medications list.  yes  Last refill. 01/03/2023 #30 1 rf  Future visit scheduled.   yes  Notes to clinic.  Refill not delegated.    Requested Prescriptions  Pending Prescriptions Disp Refills   ALPRAZolam  (XANAX ) 0.25 MG tablet [Pharmacy Med Name: ALPRAZOLAM  0.25 MG TABLET] 30 tablet     Sig: TAKE 1 TABLET BY MOUTH 2 TIMES DAILY AS NEEDED FOR ANXIETY.     Not Delegated - Psychiatry: Anxiolytics/Hypnotics 2 Failed - 01/21/2024  4:29 PM      Failed - This refill cannot be delegated      Failed - Urine Drug Screen completed in last 360 days      Passed - Patient is not pregnant      Passed - Valid encounter within last 6 months    Recent Outpatient Visits           3 months ago Tendinitis of left shoulder   [place] [facility] at [facility] [address], Leita DEL, MD   4 months ago Contusion of middle toe, left, initial encounter   [facility] Care & [facility] Medicine at [facility], Leita DEL, MD   4 months ago Biceps tendonitis, left   [facility] at [facility], Leita DEL, MD   6 months ago Dysuria   [facility] Primary Care & Sports Medicine at [facility], MD   7 months ago Gross hematuria   [facility] Primary [facility] & Sports Medicine at [facility], Leita DEL, MD       Future Appointments             In 4 months [name], MD [facility] Primary Care & Sports Medicine at [facility], [phone] [place]

## 2024-03-16 ENCOUNTER — Other Ambulatory Visit: Payer: Self-pay | Admitting: Internal Medicine

## 2024-03-16 DIAGNOSIS — F419 Anxiety disorder, unspecified: Secondary | ICD-10-CM

## 2024-03-18 NOTE — Telephone Encounter (Signed)
 Requested Prescriptions  Pending Prescriptions Disp Refills   buPROPion  (WELLBUTRIN  XL) 150 MG 24 hr tablet [Pharmacy Med Name: BUPROPION  HCL XL 150 MG TABLET] 270 tablet 0    Sig: TAKE 3 TABLETS BY MOUTH DAILY     Psychiatry: Antidepressants - bupropion  Passed - 03/18/2024  2:45 PM      Passed - Cr in normal range and within 360 days    Creatinine, Ser  Date Value Ref Range Status  05/24/2023 0.96 0.57 - 1.00 mg/dL Final         Passed - AST in normal range and within 360 days    AST  Date Value Ref Range Status  05/24/2023 14 0 - 40 IU/L Final         Passed - ALT in normal range and within 360 days    ALT  Date Value Ref Range Status  05/24/2023 16 0 - 32 IU/L Final         Passed - Last BP in normal range    BP Readings from Last 1 Encounters:  10/03/23 122/76         Passed - Valid encounter within last 6 months    Recent Outpatient Visits           5 months ago Tendinitis of left shoulder   [place] [facility] at [facility], Leita DEL, MD   6 months ago Contusion of middle toe, left, initial encounter   [facility] at [facility], Leita DEL, MD   6 months ago Biceps tendonitis, left   [facility] at [facility], Leita DEL, MD   8 months ago Dysuria   [facility] Medicine at [facility], MD   9 months ago Gross hematuria   [facility] Primary Care & Sports Medicine at [facility], Leita DEL, MD       Future Appointments             In 2 months [name], MD [facility] Primary Care & [facility] Medicine at [facility], [phone] [place]

## 2024-03-25 ENCOUNTER — Encounter: Payer: Self-pay | Admitting: Internal Medicine

## 2024-03-25 ENCOUNTER — Ambulatory Visit: Admitting: Internal Medicine

## 2024-03-25 VITALS — BP 112/78 | HR 84 | Ht 67.0 in | Wt 186.0 lb

## 2024-03-25 DIAGNOSIS — R635 Abnormal weight gain: Secondary | ICD-10-CM | POA: Insufficient documentation

## 2024-03-25 DIAGNOSIS — F419 Anxiety disorder, unspecified: Secondary | ICD-10-CM

## 2024-03-25 MED ORDER — NALTREXONE HCL 50 MG PO TABS: 25.0000 mg | ORAL_TABLET | Freq: Every day | ORAL | 0 refills | Status: AC

## 2024-03-25 NOTE — Assessment & Plan Note (Signed)
 On bupropion  alone - dose reduced to 300 mg per day. She feels that this is helpful.

## 2024-03-25 NOTE — Assessment & Plan Note (Signed)
 Continue to work on exercise regularly, healthy diet with reduction in portions. Will try adding Naltrexone  12.5 mg bid for one month. Message with results if able to get the medications.

## 2024-03-25 NOTE — Progress Notes (Signed)
 Date:  03/25/2024   Name:  Vanessa Mcneil   DOB:  01-16-1976   MRN:  [id number]   Chief Complaint: Weight Management Screening  HPI Weight concerns - she is perimenopausal and having some irritability.  She is on Bupropion  and doing fairly well.  Worried about ongoing gradual weight gain despite no change in diet or exercise.  She heard about naltrexone  with bupropion  and wondered about using these together to help curb her appetite.  Review of Systems  Constitutional:  Positive for unexpected weight change.  Respiratory:  Negative for chest tightness and shortness of breath.   Cardiovascular:  Negative for chest pain and palpitations.  Musculoskeletal:  Positive for arthralgias (random aches and pains.).  Psychiatric/Behavioral:  Negative for dysphoric mood and sleep disturbance. The patient is not nervous/anxious.      Lab Results  Component Value Date   NA 140 05/24/2023   K 4.0 05/24/2023   CO2 23 05/24/2023   GLUCOSE 89 05/24/2023   BUN 16 05/24/2023   CREATININE 0.96 05/24/2023   CALCIUM 9.8 05/24/2023   EGFR 73 05/24/2023   GFRNONAA 92 05/14/2017   Lab Results  Component Value Date   CHOL 183 05/24/2023   HDL 72 05/24/2023   LDLCALC 102 (H) 05/24/2023   TRIG 47 05/24/2023   CHOLHDL 2.5 05/24/2023   Lab Results  Component Value Date   TSH 2.150 05/24/2023   Lab Results  Component Value Date   HGBA1C 5.2 05/24/2023   Lab Results  Component Value Date   WBC 5.2 05/24/2023   HGB 14.0 05/24/2023   HCT 42.1 05/24/2023   MCV 88 05/24/2023   PLT 267 05/24/2023   Lab Results  Component Value Date   ALT 16 05/24/2023   AST 14 05/24/2023   ALKPHOS 69 05/24/2023   BILITOT 0.8 05/24/2023   No results found for: MARIEN BOLLS, VD25OH   Patient Active Problem List   Diagnosis Date Noted   Weight gain, abnormal 03/25/2024   Dysuria 06/26/2023   Hot flushes, perimenopausal 05/03/2021   Anxiety 02/07/2015   Family history of malignant  melanoma 02/07/2015   Oral herpes simplex, 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  Active Medications[3]     10/03/2023    8:19 AM 08/31/2023   10:32 AM 06/26/2023    4:34 PM 06/01/2023    9:41 AM  GAD 7 : Generalized Anxiety Score  Nervous, Anxious, on Edge 0 0 0 0  Control/stop worrying 0 0 0 0  Worry too much - different things 0 0 0 0  Trouble relaxing 0 0 0 0  Restless 0 0 0 0  Easily annoyed or irritable 0 0 0 0  Afraid - awful might happen 0 0 0 0  Total GAD 7 Score 0 0 0 0  Anxiety Difficulty Not difficult at all Not difficult at all Not difficult at all Not difficult at all       10/03/2023    8:19 AM 08/31/2023   10:32 AM 06/26/2023    4:34 PM  Depression screen PHQ 2/9  Decreased Interest 0 0 0  Down, Depressed, Hopeless 0 0 0  PHQ - 2 Score 0 0 0  Altered sleeping 0 0 0  Tired, decreased energy  0 0 0  Change in appetite 0 0 0  Feeling bad or failure about yourself  0 0 0  Trouble concentrating 0 0 0  Moving slowly or fidgety/restless 0 0 0  Suicidal thoughts 0 0 0  PHQ-9 Score 0  0  0   Difficult doing work/chores Not difficult at all Not difficult at all Not difficult at all     Data saved with a previous flowsheet row definition    BP Readings from Last 3 Encounters:  03/25/24 112/78  10/03/23 122/76  08/31/23 116/72    Physical Exam Vitals and nursing note reviewed.  Constitutional:      General: She is not in acute distress.    Appearance: She is well-developed.  HENT:     Head: Normocephalic and atraumatic.  Pulmonary:     Effort: Pulmonary effort is normal. No respiratory distress.  Skin:    General: Skin is warm and dry.     Findings: No rash.  Neurological:     Mental Status: She is alert and oriented to person, place, and  time.  Psychiatric:        Mood and Affect: Mood normal.        Behavior: Behavior normal.     Wt Readings from Last 3 Encounters:  03/25/24 186 lb (84.4 kg)  10/03/23 182 lb (82.6 kg)  08/31/23 182 lb 8 oz (82.8 kg)    BP 112/78   Pulse 84   Ht 5' 7 (1.702 m)   Wt 186 lb (84.4 kg)   LMP 02/19/2024   SpO2 99%   BMI 29.13 kg/m   Assessment and Plan:  Problem List Items Addressed This Visit       Unprioritized   Anxiety - Primary (Chronic)   On bupropion  alone - dose reduced to 300 mg per day. She feels that this is helpful.      Relevant Medications   naltrexone  (DEPADE) 50 MG tablet   Weight gain, abnormal   Continue to work on exercise regularly, healthy diet with reduction in portions. Will try adding Naltrexone  12.5 mg bid for one month. Message with results if able to get the medications.      Relevant Medications   naltrexone  (DEPADE) 50 MG tablet    No follow-ups on file.    Leita HILARIO Adie, MD [facility] Primary Care and Sports Medicine [place]           [1]  Allergies Allergen Reactions   Penicillins   [2]  Social History Tobacco Use   Smoking status: Never   Smokeless tobacco: Never   Tobacco comments:    Never smoked  Vaping Use   Vaping status: Never Used  Substance Use Topics   Alcohol use: Yes    Comment: May have 1/2 mixed drinks during special events.   Drug use: No  [3]  Current Meds  Medication Sig   ALPRAZolam  (XANAX ) 0.25 MG tablet TAKE 1 TABLET BY MOUTH 2 TIMES DAILY AS NEEDED FOR ANXIETY.   buPROPion  (WELLBUTRIN  XL) 150 MG 24 hr tablet TAKE 3 TABLETS BY MOUTH DAILY (Patient taking differently: Take 2 tablets by mouth daily.)   naltrexone  (DEPADE) 50 MG tablet Take 0.5 tablets (25 mg total) by mouth daily.

## 2024-05-23 ENCOUNTER — Encounter: Payer: 59 | Admitting: Student
